# Patient Record
Sex: Female | Born: 1975 | Race: Black or African American | Hispanic: No | Marital: Married | Smoking: Never smoker
Health system: Southern US, Community
[De-identification: ages and names within clinical notes are randomized; demographics above are authoritative.]

## PROBLEM LIST (undated history)

## (undated) ENCOUNTER — Inpatient Hospital Stay (HOSPITAL_COMMUNITY): Payer: Self-pay

## (undated) DIAGNOSIS — Z8619 Personal history of other infectious and parasitic diseases: Secondary | ICD-10-CM

## (undated) DIAGNOSIS — D219 Benign neoplasm of connective and other soft tissue, unspecified: Secondary | ICD-10-CM

## (undated) DIAGNOSIS — J45909 Unspecified asthma, uncomplicated: Secondary | ICD-10-CM

## (undated) DIAGNOSIS — O09529 Supervision of elderly multigravida, unspecified trimester: Secondary | ICD-10-CM

## (undated) DIAGNOSIS — B009 Herpesviral infection, unspecified: Secondary | ICD-10-CM

## (undated) HISTORY — DX: Supervision of elderly multigravida, unspecified trimester: O09.529

## (undated) HISTORY — DX: Personal history of other infectious and parasitic diseases: Z86.19

## (undated) HISTORY — PX: NO PAST SURGERIES: SHX2092

---

## 2004-09-16 ENCOUNTER — Ambulatory Visit: Payer: Self-pay | Admitting: Internal Medicine

## 2006-11-16 ENCOUNTER — Ambulatory Visit: Payer: Self-pay | Admitting: Internal Medicine

## 2008-03-10 ENCOUNTER — Ambulatory Visit: Payer: Self-pay | Admitting: Internal Medicine

## 2008-03-10 DIAGNOSIS — J309 Allergic rhinitis, unspecified: Secondary | ICD-10-CM | POA: Insufficient documentation

## 2008-03-10 DIAGNOSIS — J45909 Unspecified asthma, uncomplicated: Secondary | ICD-10-CM | POA: Insufficient documentation

## 2011-01-07 NOTE — Assessment & Plan Note (Signed)
[facility]                             PULMONARY OFFICE NOTE   NAME:Teresa Barber, Teresa Barber                       MRN:          [id number]  DATE:11/16/2006                            DOB:          04-28-1976    PROBLEM:  1. Asthma.  2. Allergic rhinitis.   HISTORY:  She is out of medications now, but says as long as she has her  routine meds, her asthma and rhinitis are well controlled.  She was  pregnant when last here and stopped her allergy vaccine while pregnant  in 2006.  No seasonal flare yet.  She has her hands full with 2 young  sons today.   MEDICATION:  1. Advair 250/50.  2. Allegra 60 mg.  3. Birth control pills.  4. Rescue albuterol inhaler.   No medication allergy.   OBJECTIVE:  Weight 155 pounds, BP 108/72, pulse 90, room air saturation  97%.  There is white mucous in her nose which is not obstructed.  No evidence  of postnasal drainage or pharyngeal irritation.  No adenopathy.  Lung fields are clear with good airflow bilaterally.  Heart sounds are regular without murmur.   IMPRESSION:  Allergic rhinitis and asthma, adequately controlled when  medications are available.  We discussed options again.   PLAN:  Refill Advair 250/50, Allegra 60 mg b.i.d. p.r.n., and albuterol  rescue inhaler for 2 puffs q.i.d. p.r.n. use.  We discussed common  triggers to avoid.  Schedule return in 1 year.     Clinton D. Maple Hudson, MD, Tonny Bollman, FACP  Electronically Signed    CDY/MedQ  DD: 11/18/2006  DT: 11/18/2006  Job #: [id number]

## 2013-10-09 ENCOUNTER — Other Ambulatory Visit: Payer: Self-pay | Admitting: Obstetrics and Gynecology

## 2013-10-09 DIAGNOSIS — N6019 Diffuse cystic mastopathy of unspecified breast: Secondary | ICD-10-CM

## 2013-10-22 ENCOUNTER — Ambulatory Visit
Admission: RE | Admit: 2013-10-22 | Discharge: 2013-10-22 | Disposition: A | Discharge status: Home / Self Care | Payer: 59 | Source: Ambulatory Visit | Attending: Obstetrics and Gynecology | Admitting: Obstetrics and Gynecology

## 2013-10-22 DIAGNOSIS — N6019 Diffuse cystic mastopathy of unspecified breast: Secondary | ICD-10-CM

## 2015-04-25 ENCOUNTER — Emergency Department (HOSPITAL_COMMUNITY): Payer: Medicaid Other

## 2015-04-25 ENCOUNTER — Encounter (HOSPITAL_COMMUNITY): Payer: Self-pay | Admitting: Emergency Medicine

## 2015-04-25 ENCOUNTER — Emergency Department (HOSPITAL_COMMUNITY)
Admission: EM | Admit: 2015-04-25 | Discharge: 2015-04-25 | Disposition: A | Discharge status: Home / Self Care | Payer: Medicaid Other | Attending: Emergency Medicine | Admitting: Emergency Medicine

## 2015-04-25 DIAGNOSIS — Z3A01 Less than 8 weeks gestation of pregnancy: Secondary | ICD-10-CM | POA: Diagnosis not present

## 2015-04-25 DIAGNOSIS — J45909 Unspecified asthma, uncomplicated: Secondary | ICD-10-CM | POA: Diagnosis not present

## 2015-04-25 DIAGNOSIS — N939 Abnormal uterine and vaginal bleeding, unspecified: Secondary | ICD-10-CM

## 2015-04-25 DIAGNOSIS — O99511 Diseases of the respiratory system complicating pregnancy, first trimester: Secondary | ICD-10-CM | POA: Diagnosis not present

## 2015-04-25 DIAGNOSIS — O209 Hemorrhage in early pregnancy, unspecified: Secondary | ICD-10-CM | POA: Insufficient documentation

## 2015-04-25 HISTORY — DX: Unspecified asthma, uncomplicated: J45.909

## 2015-04-25 LAB — WET PREP, GENITAL
CLUE CELLS WET PREP: NONE SEEN
Trich, Wet Prep: NONE SEEN
Yeast Wet Prep HPF POC: NONE SEEN

## 2015-04-25 LAB — CBC WITH DIFFERENTIAL/PLATELET
Basophils Absolute: 0 10*3/uL (ref 0.0–0.1)
Basophils Relative: 0 % (ref 0–1)
EOS PCT: 8 % — AB (ref 0–5)
Eosinophils Absolute: 0.5 10*3/uL (ref 0.0–0.7)
HEMATOCRIT: 38.3 % (ref 36.0–46.0)
Hemoglobin: 12.5 g/dL (ref 12.0–15.0)
LYMPHS ABS: 2.2 10*3/uL (ref 0.7–4.0)
LYMPHS PCT: 39 % (ref 12–46)
MCH: 26.9 pg (ref 26.0–34.0)
MCHC: 32.6 g/dL (ref 30.0–36.0)
MCV: 82.5 fL (ref 78.0–100.0)
MONO ABS: 0.2 10*3/uL (ref 0.1–1.0)
MONOS PCT: 4 % (ref 3–12)
NEUTROS ABS: 2.7 10*3/uL (ref 1.7–7.7)
Neutrophils Relative %: 49 % (ref 43–77)
PLATELETS: 215 10*3/uL (ref 150–400)
RBC: 4.64 MIL/uL (ref 3.87–5.11)
RDW: 12.6 % (ref 11.5–15.5)
WBC: 5.6 10*3/uL (ref 4.0–10.5)

## 2015-04-25 LAB — I-STAT BETA HCG BLOOD, ED (MC, WL, AP ONLY): I-stat hCG, quantitative: 467.1 m[IU]/mL — ABNORMAL HIGH (ref <5)

## 2015-04-25 LAB — TYPE AND SCREEN
ABO/RH(D): O POS
Antibody Screen: NEGATIVE

## 2015-04-25 MED ORDER — HYDROMORPHONE HCL 1 MG/ML IJ SOLN: 1.0000 mg | Freq: Once | INTRAMUSCULAR | Status: DC

## 2015-04-25 NOTE — ED Notes (Signed)
Pt c/o vaginal bleeding x 2 days.  States last menstrual cycle was normal and ended 3 days ago, but the next day, started having some more spotting and lower back pain/cramps.  Yesterday, pt noticed moderate vaginal bleeding.  Pt denies birth control and took 2 home pregnancy tests 1 week ago and 3 days ago, both came back questionable/"faint."

## 2015-04-25 NOTE — Discharge Instructions (Signed)
Please follow up at the [facility] to have a beta hcg drawn in 48-72 hours and an OB Ultrasound in 10 days.  Please return to the Emergency Department if symptoms worsen or new onset of abdominal pain, nausea, vomiting, fever, lightheadedness, weakness.

## 2015-04-25 NOTE — ED Notes (Signed)
PA at bedside.

## 2015-04-25 NOTE — ED Notes (Signed)
Patient transported to Ultrasound 

## 2015-04-25 NOTE — ED Provider Notes (Signed)
CSN: [id number]     Arrival date & time 04/25/15  1111 History   First MD Initiated Contact with Patient 04/25/15 1114     Chief Complaint  Patient presents with  . Vaginal Bleeding     (Consider location/radiation/quality/duration/timing/severity/associated sxs/prior Treatment) HPI Comments: Pt is a 39 yo female who presents to the ED with complaint of vaginal bleeding, onset 2 days. Pt reports her LMP was 04/17/15 and was longer (7days vs. normally 5 days), heavier and she has more cramping than usual. She notes her period ended 3 days ago and then the following day she started to have some spotting and lower back pain/cramping that lasted for appx. 5 min. Pt denies using any form of birth control and states she took two home pregnancy tests that both came back "questionable". Pt is G3P2A1 (planned abortion). Endorses lightheadedness, nausea. Denies headache, SOB, CP, diarrhea, constipation, blood in stool or urine, urinary sxs. Denies any  abdominal pain or nausea while in ED.   Patient is a 39 y.o. female presenting with vaginal bleeding.  Vaginal Bleeding Associated symptoms: abdominal pain and nausea     Past Medical History  Diagnosis Date  . Asthma    History reviewed. No pertinent past surgical history. History reviewed. No pertinent family history. Social History  Substance Use Topics  . Smoking status: Never Smoker   . Smokeless tobacco: None  . Alcohol Use: No   OB History    No data available     Review of Systems  Gastrointestinal: Positive for nausea and abdominal pain.  Genitourinary: Positive for vaginal bleeding.  Neurological: Positive for light-headedness.      Allergies  Review of patient's allergies indicates not on file.  Home Medications   Prior to Admission medications   Not on File   BP 113/85 mmHg  Pulse 68  Temp(Src) 98.6 F (37 C) (Oral)  Resp 16  Ht 5\' 7"  (1.702 m)  Wt 148 lb (67.132 kg)  BMI 23.17 kg/m2  SpO2 99%  LMP 04/22/2015  (Exact Date) Physical Exam  Constitutional: She is oriented to person, place, and time. She appears well-developed and well-nourished.  HENT:  Head: Normocephalic and atraumatic.  Mouth/Throat: Oropharynx is clear and moist.  Eyes: Conjunctivae and EOM are normal. Pupils are equal, round, and reactive to light. Right eye exhibits no discharge. Left eye exhibits no discharge. No scleral icterus.  Neck: Normal range of motion. Neck supple.  Cardiovascular: Normal rate, regular rhythm, normal heart sounds and intact distal pulses.   Pulmonary/Chest: Effort normal and breath sounds normal. She has no wheezes. She has no rales. She exhibits no tenderness.  Abdominal: Soft. Bowel sounds are normal. She exhibits no distension and no mass. There is no tenderness. There is no rebound and no guarding.  Genitourinary: Uterus normal. Uterus is not tender. Cervix exhibits no motion tenderness and no friability. Right adnexum displays fullness. Right adnexum displays no tenderness. Left adnexum displays no tenderness. There is bleeding in the vagina. No tenderness in the vagina. No vaginal discharge found.  Blood oozing from cervical os.   Musculoskeletal: Normal range of motion. She exhibits no edema.  Lymphadenopathy:    She has no cervical adenopathy.  Neurological: She is alert and oriented to person, place, and time.  Skin: Skin is warm and dry.  Nursing note and vitals reviewed.   ED Course  Procedures (including critical care time) Labs Review Labs Reviewed  I-STAT BETA HCG BLOOD, ED ([facility], [facility], [facility] ONLY)  Imaging Review No results found. I have personally reviewed and evaluated these images and lab results as part of my medical decision-making.  Filed Vitals:   04/25/15 1513  BP:   Pulse:   Temp: 98.6 F (37 C)  Resp:     MDM   Final diagnoses:  Vaginal bleeding in pregnancy, first trimester    Pt presents with vaginal bleeding and abdominal cramping and nausea that have since  resolved. G3P2A1. VSS. Pelvic exam revealed blood in vaginal vault and oozing from cervical os, fullness noted to right adnexa without tenderness.   CBC unremarkable. Beta hcg 467.1, ordered OB US. Wet prep unremarkable. Type and screen ordered. Pt is hemodynamically stable at this time.   OB US revealed no intrauterine gestational sac, yolk sac, fetal pole, or cardiac activity visualized.  Discussed results with pt. Pt agrees with plan for d/c and to follow up with [facility] in 48-72 hrs to have a quant beta hcg drawn along with having another US done in 10 days. I feel that the pt is reliable with follow up. Evaluation does not show pathology requring ongoing emergent intervention or admission. Pt is hemodynamically stable and mentating appropriately. All questions answered. Return precautions discussed and outpatient follow up given.      Chesley Noon [place], [place] 04/25/15 1859  Tanna Furry, MD 04/26/15 [phone]

## 2015-04-27 LAB — ABO/RH: ABO/RH(D): O POS

## 2015-04-28 LAB — GC/CHLAMYDIA PROBE AMP (~~LOC~~) NOT AT ARMC
Chlamydia: NEGATIVE
Neisseria Gonorrhea: NEGATIVE

## 2015-09-17 ENCOUNTER — Telehealth (HOSPITAL_BASED_OUTPATIENT_CLINIC_OR_DEPARTMENT_OTHER): Payer: Self-pay | Admitting: Emergency Medicine

## 2016-03-18 ENCOUNTER — Inpatient Hospital Stay (HOSPITAL_COMMUNITY): Payer: BLUE CROSS/BLUE SHIELD

## 2016-03-18 ENCOUNTER — Inpatient Hospital Stay (HOSPITAL_COMMUNITY)
Admission: AD | Admit: 2016-03-18 | Discharge: 2016-03-18 | Disposition: A | Discharge status: Home / Self Care | Payer: BLUE CROSS/BLUE SHIELD | Source: Ambulatory Visit | Attending: Obstetrics and Gynecology | Admitting: Obstetrics and Gynecology

## 2016-03-18 ENCOUNTER — Encounter (HOSPITAL_COMMUNITY): Payer: Self-pay | Admitting: *Deleted

## 2016-03-18 DIAGNOSIS — O26892 Other specified pregnancy related conditions, second trimester: Secondary | ICD-10-CM | POA: Diagnosis present

## 2016-03-18 DIAGNOSIS — O3412 Maternal care for benign tumor of corpus uteri, second trimester: Secondary | ICD-10-CM | POA: Diagnosis not present

## 2016-03-18 DIAGNOSIS — O208 Other hemorrhage in early pregnancy: Secondary | ICD-10-CM | POA: Insufficient documentation

## 2016-03-18 DIAGNOSIS — J45909 Unspecified asthma, uncomplicated: Secondary | ICD-10-CM | POA: Insufficient documentation

## 2016-03-18 DIAGNOSIS — O468X1 Other antepartum hemorrhage, first trimester: Secondary | ICD-10-CM

## 2016-03-18 DIAGNOSIS — O99512 Diseases of the respiratory system complicating pregnancy, second trimester: Secondary | ICD-10-CM | POA: Diagnosis not present

## 2016-03-18 DIAGNOSIS — O09521 Supervision of elderly multigravida, first trimester: Secondary | ICD-10-CM | POA: Diagnosis not present

## 2016-03-18 DIAGNOSIS — O209 Hemorrhage in early pregnancy, unspecified: Secondary | ICD-10-CM

## 2016-03-18 DIAGNOSIS — R109 Unspecified abdominal pain: Secondary | ICD-10-CM | POA: Diagnosis present

## 2016-03-18 DIAGNOSIS — D259 Leiomyoma of uterus, unspecified: Secondary | ICD-10-CM | POA: Diagnosis not present

## 2016-03-18 DIAGNOSIS — O418X10 Other specified disorders of amniotic fluid and membranes, first trimester, not applicable or unspecified: Secondary | ICD-10-CM

## 2016-03-18 LAB — HCG, QUANTITATIVE, PREGNANCY: HCG, BETA CHAIN, QUANT, S: 73606 m[IU]/mL — AB (ref <5)

## 2016-03-18 LAB — URINALYSIS, ROUTINE W REFLEX MICROSCOPIC
Bilirubin Urine: NEGATIVE
Glucose, UA: NEGATIVE mg/dL
KETONES UR: 15 mg/dL — AB
LEUKOCYTES UA: NEGATIVE
NITRITE: NEGATIVE
PH: 6 (ref 5.0–8.0)
Protein, ur: NEGATIVE mg/dL
Specific Gravity, Urine: 1.025 (ref 1.005–1.030)

## 2016-03-18 LAB — POCT PREGNANCY, URINE: Preg Test, Ur: POSITIVE — AB

## 2016-03-18 LAB — URINE MICROSCOPIC-ADD ON

## 2016-03-18 LAB — CBC WITH DIFFERENTIAL/PLATELET
BASOS PCT: 0 %
Basophils Absolute: 0 10*3/uL (ref 0.0–0.1)
EOS ABS: 0.4 10*3/uL (ref 0.0–0.7)
EOS PCT: 3 %
HCT: 36.1 % (ref 36.0–46.0)
Hemoglobin: 12.5 g/dL (ref 12.0–15.0)
LYMPHS ABS: 3.6 10*3/uL (ref 0.7–4.0)
Lymphocytes Relative: 30 %
MCH: 28 pg (ref 26.0–34.0)
MCHC: 34.6 g/dL (ref 30.0–36.0)
MCV: 80.8 fL (ref 78.0–100.0)
MONOS PCT: 3 %
Monocytes Absolute: 0.4 10*3/uL (ref 0.1–1.0)
Neutro Abs: 7.7 10*3/uL (ref 1.7–7.7)
Neutrophils Relative %: 64 %
PLATELETS: 208 10*3/uL (ref 150–400)
RBC: 4.47 MIL/uL (ref 3.87–5.11)
RDW: 13.4 % (ref 11.5–15.5)
WBC: 12.1 10*3/uL — AB (ref 4.0–10.5)

## 2016-03-18 NOTE — Discharge Instructions (Signed)
Subchorionic Hematoma A subchorionic hematoma is a gathering of blood between the outer wall of the placenta and the inner wall of the womb (uterus). The placenta is the organ that connects the fetus to the wall of the uterus. The placenta performs the feeding, breathing (oxygen to the fetus), and waste removal (excretory work) of the fetus.  Subchorionic hematoma is the most common abnormality found on a result from ultrasonography done during the first trimester or early second trimester of pregnancy. If there has been little or no vaginal bleeding, early small hematomas usually shrink on their own and do not affect your baby or pregnancy. The blood is gradually absorbed over 1-2 weeks. When bleeding starts later in pregnancy or the hematoma is larger or occurs in an older pregnant woman, the outcome may not be as good. Larger hematomas may get bigger, which increases the chances for miscarriage. Subchorionic hematoma also increases the risk of premature detachment of the placenta from the uterus, preterm (premature) labor, and stillbirth. HOME CARE INSTRUCTIONS  Stay on bed rest if your health care provider recommends this. Although bed rest will not prevent more bleeding or prevent a miscarriage, your health care provider may recommend bed rest until you are advised otherwise.  Avoid heavy lifting (more than 10 lb [4.5 kg]), exercise, sexual intercourse, or douching as directed by your health care provider.  Keep track of the number of pads you use each day and how soaked (saturated) they are. Write down this information.  Do not use tampons.  Keep all follow-up appointments as directed by your health care provider. Your health care provider may ask you to have follow-up blood tests or ultrasound tests or both. SEEK IMMEDIATE MEDICAL CARE IF:  You have severe cramps in your stomach, back, abdomen, or pelvis.  You have a fever.  You pass large clots or tissue. Save any tissue for your health  care provider to look at.  Your bleeding increases or you become lightheaded, feel weak, or have fainting episodes.   This information is not intended to replace advice given to you by your health care provider. Make sure you discuss any questions you have with your health care provider.   Document Released: 11/23/2006 Document Revised: 08/29/2014 Document Reviewed: 03/07/2013 Elsevier Interactive Patient Education 2016 Elsevier Inc.   Vaginal Bleeding During Pregnancy, First Trimester A small amount of bleeding (spotting) from the vagina is common in early pregnancy. Sometimes the bleeding is normal and is not a problem, and sometimes it is a sign of something serious. Be sure to tell your doctor about any bleeding from your vagina right away. HOME CARE  Watch your condition for any changes.  Follow your doctor's instructions about how active you can be.  If you are on bed rest:  You may need to stay in bed and only get up to use the bathroom.  You may be allowed to do some activities.  If you need help, make plans for someone to help you.  Write down:  The number of pads you use each day.  How often you change pads.  How soaked (saturated) your pads are.  Do not use tampons.  Do not douche.  Do not have sex or orgasms until your doctor says it is okay.  If you pass any tissue from your vagina, save the tissue so you can show it to your doctor.  Only take medicines as told by your doctor.  Do not take aspirin because it can make you  bleed.  Keep all follow-up visits as told by your doctor. GET HELP IF:   You bleed from your vagina.  You have cramps.  You have labor pains.  You have a fever that does not go away after you take medicine. GET HELP RIGHT AWAY IF:   You have very bad cramps in your back or belly (abdomen).  You pass large clots or tissue from your vagina.  You bleed more.  You feel light-headed or weak.  You pass out (faint).  You have  chills.  You are leaking fluid or have a gush of fluid from your vagina.  You pass out while pooping (having a bowel movement). MAKE SURE YOU:  Understand these instructions.  Will watch your condition.  Will get help right away if you are not doing well or get worse.   This information is not intended to replace advice given to you by your health care provider. Make sure you discuss any questions you have with your health care provider.   Document Released: 12/23/2013 Document Reviewed: 12/23/2013 Elsevier Interactive Patient Education [employer].

## 2016-03-18 NOTE — MAU Provider Note (Signed)
History     CSN: [id number]  Arrival date and time: 03/18/16 1900   First Provider Initiated Contact with Patient 03/18/16 2150      Chief Complaint  Patient presents with  . Vaginal Bleeding  . Abdominal Cramping   HPI Ms. Teresa Barber is a 40 y.o. [id number] at [redacted]w[redacted]d who presents to MAU today with complaint of vaginal bleeding. The patient has a known subchorionic hemorrhage and has had light bleeding off and on prior to today. Today she states sudden onset of heavier bleeding and passing either a large clot of a gestation sac. She has had mild tenderness touch in the lower abdomen, but denies cramping or pain currently. She also denies fever or recent intercourse.   OB History    Gravida Para Term Preterm AB Living   6 2 2   3 2    SAB TAB Ectopic Multiple Live Births   2       2      Past Medical History:  Diagnosis Date  . Asthma     Past Surgical History:  Procedure Laterality Date  . NO PAST SURGERIES      Family History  Problem Relation Age of Onset  . Asthma Mother     Social History  Substance Use Topics  . Smoking status: Never Smoker  . Smokeless tobacco: Never Used  . Alcohol use No    Allergies: No Known Allergies  Prescriptions Prior to Admission  Medication Sig Dispense Refill Last Dose  . Ascorbic Acid (VITAMIN C PO) Take 1 tablet by mouth daily.   03/17/2016 at Unknown time  . DICLEGIS 10-10 MG TBEC Take 1-2 tablets by mouth 3 (three) times daily as needed (nausea).   1 03/18/2016 at Unknown time  . IRON PO Take 1 tablet by mouth at bedtime.   03/17/2016 at Unknown time  . Prenatal Vit-Fe Fumarate-FA (PRENATAL MULTIVITAMIN) TABS tablet Take 1 tablet by mouth daily at 12 noon.   03/17/2016 at Unknown time    Review of Systems  Constitutional: Negative for fever and malaise/fatigue.  Gastrointestinal: Negative for abdominal pain, constipation, diarrhea, nausea and vomiting.  Genitourinary: Negative for dysuria, frequency and urgency.       +  vaginal bleeding   Physical Exam   Blood pressure 107/62, pulse 84, temperature 98.9 F (37.2 C), resp. rate 18, height 5' 7.5" (1.715 m), weight 150 lb 9.6 oz (68.3 kg).  Physical Exam  Nursing note and vitals reviewed. Constitutional: She is oriented to person, place, and time. She appears well-developed and well-nourished. No distress.  HENT:  Head: Normocephalic and atraumatic.  Cardiovascular: Normal rate.   Respiratory: Effort normal.  GI: Soft. She exhibits no distension and no mass. There is tenderness (mild tenderness to palpation of the lower abdomen). There is no rebound and no guarding.  Genitourinary: Uterus is enlarged and tender. Cervix exhibits no motion tenderness, no discharge and no friability. There is bleeding (small blood pooling in vagina) in the vagina. No vaginal discharge found.  Genitourinary Comments: Cervix: visually closed  Neurological: She is alert and oriented to person, place, and time.  Skin: Skin is warm and dry. No erythema.  Psychiatric: She has a normal mood and affect.    Results for orders placed or performed during the hospital encounter of 03/18/16 (from the past 24 hour(s))  Urinalysis, Routine w reflex microscopic (not at [facility])     Status: Abnormal   Collection Time: 03/18/16  7:30 PM  Result Value Ref  Range   Color, Urine YELLOW YELLOW   APPearance CLEAR CLEAR   Specific Gravity, Urine 1.025 1.005 - 1.030   pH 6.0 5.0 - 8.0   Glucose, UA NEGATIVE NEGATIVE mg/dL   Hgb urine dipstick MODERATE (A) NEGATIVE   Bilirubin Urine NEGATIVE NEGATIVE   Ketones, ur 15 (A) NEGATIVE mg/dL   Protein, ur NEGATIVE NEGATIVE mg/dL   Nitrite NEGATIVE NEGATIVE   Leukocytes, UA NEGATIVE NEGATIVE  Urine microscopic-add on     Status: Abnormal   Collection Time: 03/18/16  7:30 PM  Result Value Ref Range   Squamous Epithelial / LPF 0-5 (A) NONE SEEN   WBC, UA 0-5 0 - 5 WBC/hpf   RBC / HPF 0-5 0 - 5 RBC/hpf   Bacteria, UA FEW (A) NONE SEEN  Pregnancy,  urine POC     Status: Abnormal   Collection Time: 03/18/16  7:40 PM  Result Value Ref Range   Preg Test, Ur POSITIVE (A) NEGATIVE  CBC with Differential/Platelet     Status: Abnormal   Collection Time: 03/18/16  8:30 PM  Result Value Ref Range   WBC 12.1 (H) 4.0 - 10.5 K/uL   RBC 4.47 3.87 - 5.11 MIL/uL   Hemoglobin 12.5 12.0 - 15.0 g/dL   HCT 36.1 36.0 - 46.0 %   MCV 80.8 78.0 - 100.0 fL   MCH 28.0 26.0 - 34.0 pg   MCHC 34.6 30.0 - 36.0 g/dL   RDW 13.4 11.5 - 15.5 %   Platelets 208 150 - 400 K/uL   Neutrophils Relative % 64 %   Neutro Abs 7.7 1.7 - 7.7 K/uL   Lymphocytes Relative 30 %   Lymphs Abs 3.6 0.7 - 4.0 K/uL   Monocytes Relative 3 %   Monocytes Absolute 0.4 0.1 - 1.0 K/uL   Eosinophils Relative 3 %   Eosinophils Absolute 0.4 0.0 - 0.7 K/uL   Basophils Relative 0 %   Basophils Absolute 0.0 0.0 - 0.1 K/uL  hCG, quantitative, pregnancy     Status: Abnormal   Collection Time: 03/18/16  8:30 PM  Result Value Ref Range   hCG, Beta Chain, Quant, S 73,606 (H) <5 mIU/mL   US Ob Comp Less 14 Wks  Result Date: 03/18/2016 CLINICAL DATA:  Heavy vaginal bleeding EXAM: OBSTETRIC <14 WK ULTRASOUND TECHNIQUE: Transabdominal ultrasound was performed for evaluation of the gestation as well as the maternal uterus and adnexal regions. COMPARISON:  None. FINDINGS: Intrauterine gestational sac: Single Yolk sac:  Present Embryo:  Present Cardiac Activity: Present Heart Rate: 162 bpm CRL:   45.5  mm   11 w 2 d                  [place] EDC: 10/05/2016 Subchorionic hemorrhage: Moderate to large complex appearing subchorionic hemorrhage is noted. It measures approximately 5.1 x 4.4 x 1.9 cm in greatest dimension Maternal uterus/adnexae: Multiple uterine fibroids are noted. Largest of these measures 5.6 cm in the posterior myometrium. Right fundal and midbody fibroids are also noted. IMPRESSION: Single live intrauterine gestation at 11 weeks 2 days. Multiple uterine fibroids are noted. Moderate large complex  appearing subchorionic hemorrhage. Electronically Signed   By: Inez Catalina M.D.   On: 03/18/2016 21:18   MAU Course  Procedures None  MDM +UPT CBC, Quant hCG and [place] today  Discussed patient with Dr. Helane Rima, ok for discharge at this time with bleeding precautions and pelvic rest instructions Assessment and Plan  A: SIUP at [redacted]w[redacted]d Vaginal bleeding in pregnancy,  first trimester Moderate to large subchorionic hemorrhage  P: Discharge home Bleeding precautions and pelvic rest discussed Patient advised to follow-up with [facility] as scheduled or sooner PRN Patient may return to MAU as needed or if her condition were to change or worsen   Luvenia Redden, PA-C  03/18/2016, 9:51 PM

## 2016-03-18 NOTE — MAU Note (Signed)
I am [redacted] wks along and think I passed the gestational sac today but unsure if passed it all. Hx subchorionic hemorrhage and had gush fld about 1300 with a lot of bleeding and think I saw the sac. When I get up and move cont to have heavy bleeding

## 2016-06-16 ENCOUNTER — Encounter (HOSPITAL_COMMUNITY): Payer: Self-pay | Admitting: Obstetrics and Gynecology

## 2016-06-16 ENCOUNTER — Other Ambulatory Visit (HOSPITAL_COMMUNITY): Payer: Self-pay | Admitting: Obstetrics and Gynecology

## 2016-06-16 DIAGNOSIS — Z3689 Encounter for other specified antenatal screening: Secondary | ICD-10-CM

## 2016-06-16 DIAGNOSIS — Z3A24 24 weeks gestation of pregnancy: Secondary | ICD-10-CM

## 2016-06-16 DIAGNOSIS — O283 Abnormal ultrasonic finding on antenatal screening of mother: Secondary | ICD-10-CM

## 2016-06-24 ENCOUNTER — Ambulatory Visit (HOSPITAL_COMMUNITY)
Admission: RE | Admit: 2016-06-24 | Discharge: 2016-06-24 | Disposition: A | Discharge status: Home / Self Care | Payer: BLUE CROSS/BLUE SHIELD | Source: Ambulatory Visit | Attending: Obstetrics and Gynecology | Admitting: Obstetrics and Gynecology

## 2016-06-24 ENCOUNTER — Encounter (HOSPITAL_COMMUNITY): Payer: Self-pay

## 2016-06-24 DIAGNOSIS — Z363 Encounter for antenatal screening for malformations: Secondary | ICD-10-CM | POA: Diagnosis not present

## 2016-06-24 DIAGNOSIS — O09522 Supervision of elderly multigravida, second trimester: Secondary | ICD-10-CM | POA: Insufficient documentation

## 2016-06-24 DIAGNOSIS — Z3A24 24 weeks gestation of pregnancy: Secondary | ICD-10-CM | POA: Diagnosis not present

## 2016-06-24 DIAGNOSIS — Z3689 Encounter for other specified antenatal screening: Secondary | ICD-10-CM

## 2016-06-24 DIAGNOSIS — O283 Abnormal ultrasonic finding on antenatal screening of mother: Secondary | ICD-10-CM | POA: Insufficient documentation

## 2016-06-27 ENCOUNTER — Encounter (HOSPITAL_COMMUNITY): Payer: Self-pay

## 2016-06-30 ENCOUNTER — Other Ambulatory Visit (HOSPITAL_COMMUNITY): Payer: Self-pay

## 2016-07-13 ENCOUNTER — Encounter (HOSPITAL_COMMUNITY): Payer: Self-pay | Admitting: *Deleted

## 2016-07-13 ENCOUNTER — Inpatient Hospital Stay (HOSPITAL_COMMUNITY)
Admission: AD | Admit: 2016-07-13 | Discharge: 2016-07-19 | DRG: 782 | Disposition: A | Discharge status: Home / Self Care | Payer: BLUE CROSS/BLUE SHIELD | Source: Ambulatory Visit | Attending: Obstetrics and Gynecology | Admitting: Obstetrics and Gynecology

## 2016-07-13 ENCOUNTER — Inpatient Hospital Stay (HOSPITAL_COMMUNITY): Payer: BLUE CROSS/BLUE SHIELD

## 2016-07-13 DIAGNOSIS — Z3A27 27 weeks gestation of pregnancy: Secondary | ICD-10-CM | POA: Diagnosis not present

## 2016-07-13 DIAGNOSIS — O3442 Maternal care for other abnormalities of cervix, second trimester: Secondary | ICD-10-CM | POA: Diagnosis present

## 2016-07-13 DIAGNOSIS — O4592 Premature separation of placenta, unspecified, second trimester: Secondary | ICD-10-CM | POA: Diagnosis present

## 2016-07-13 DIAGNOSIS — O09522 Supervision of elderly multigravida, second trimester: Secondary | ICD-10-CM | POA: Diagnosis not present

## 2016-07-13 DIAGNOSIS — O4593 Premature separation of placenta, unspecified, third trimester: Secondary | ICD-10-CM

## 2016-07-13 DIAGNOSIS — O3412 Maternal care for benign tumor of corpus uteri, second trimester: Secondary | ICD-10-CM | POA: Diagnosis present

## 2016-07-13 DIAGNOSIS — N841 Polyp of cervix uteri: Secondary | ICD-10-CM | POA: Diagnosis present

## 2016-07-13 HISTORY — DX: Benign neoplasm of connective and other soft tissue, unspecified: D21.9

## 2016-07-13 LAB — CBC
HEMATOCRIT: 34.8 % — AB (ref 36.0–46.0)
Hemoglobin: 11.9 g/dL — ABNORMAL LOW (ref 12.0–15.0)
MCH: 28.7 pg (ref 26.0–34.0)
MCHC: 34.2 g/dL (ref 30.0–36.0)
MCV: 84.1 fL (ref 78.0–100.0)
PLATELETS: 189 10*3/uL (ref 150–400)
RBC: 4.14 MIL/uL (ref 3.87–5.11)
RDW: 13.8 % (ref 11.5–15.5)
WBC: 12.6 10*3/uL — AB (ref 4.0–10.5)

## 2016-07-13 LAB — DIC (DISSEMINATED INTRAVASCULAR COAGULATION) PANEL
APTT: 28 s (ref 24–36)
PLATELETS: 189 10*3/uL (ref 150–400)
SMEAR REVIEW: NONE SEEN

## 2016-07-13 LAB — DIC (DISSEMINATED INTRAVASCULAR COAGULATION)PANEL
D-Dimer, Quant: 2.07 ug/mL-FEU — ABNORMAL HIGH (ref 0.00–0.50)
Fibrinogen: 499 mg/dL — ABNORMAL HIGH (ref 210–475)
INR: 0.98
Prothrombin Time: 13 seconds (ref 11.4–15.2)

## 2016-07-13 LAB — COMPREHENSIVE METABOLIC PANEL
ALBUMIN: 3.2 g/dL — AB (ref 3.5–5.0)
ALT: 16 U/L (ref 14–54)
AST: 25 U/L (ref 15–41)
Alkaline Phosphatase: 55 U/L (ref 38–126)
Anion gap: 6 (ref 5–15)
BUN: 8 mg/dL (ref 6–20)
CHLORIDE: 106 mmol/L (ref 101–111)
CO2: 24 mmol/L (ref 22–32)
CREATININE: 0.56 mg/dL (ref 0.44–1.00)
Calcium: 9.5 mg/dL (ref 8.9–10.3)
GFR calc Af Amer: >60 mL/min (ref >60)
GLUCOSE: 75 mg/dL (ref 65–99)
POTASSIUM: 3.8 mmol/L (ref 3.5–5.1)
SODIUM: 136 mmol/L (ref 135–145)
Total Bilirubin: 0.4 mg/dL (ref 0.3–1.2)
Total Protein: 6.4 g/dL — ABNORMAL LOW (ref 6.5–8.1)

## 2016-07-13 LAB — TYPE AND SCREEN
ABO/RH(D): O POS
Antibody Screen: NEGATIVE

## 2016-07-13 LAB — ABO/RH: ABO/RH(D): O POS

## 2016-07-13 MED ORDER — LACTATED RINGERS IV SOLN
INTRAVENOUS | Status: DC
  Administered 2016-07-13 (×2): via INTRAVENOUS

## 2016-07-13 MED ORDER — LACTATED RINGERS IV SOLN
INTRAVENOUS | Status: DC
  Administered 2016-07-14 – 2016-07-15 (×2): via INTRAVENOUS

## 2016-07-13 MED ORDER — DOCUSATE SODIUM 100 MG PO CAPS
100.0000 mg | ORAL_CAPSULE | Freq: Every day | ORAL | Status: DC
  Administered 2016-07-14 – 2016-07-17 (×4): 100 mg via ORAL
  Filled 2016-07-13 (×8): qty 1

## 2016-07-13 MED ORDER — ZOLPIDEM TARTRATE 5 MG PO TABS: 5.0000 mg | ORAL_TABLET | Freq: Every evening | ORAL | Status: DC | PRN

## 2016-07-13 MED ORDER — CALCIUM CARBONATE ANTACID 500 MG PO CHEW: 2.0000 | CHEWABLE_TABLET | ORAL | Status: DC | PRN

## 2016-07-13 MED ORDER — PRENATAL MULTIVITAMIN CH
1.0000 | ORAL_TABLET | Freq: Every day | ORAL | Status: DC
  Administered 2016-07-14 – 2016-07-17 (×2): 1 via ORAL
  Filled 2016-07-13 (×5): qty 1

## 2016-07-13 MED ORDER — ACETAMINOPHEN 325 MG PO TABS: 650.0000 mg | ORAL_TABLET | ORAL | Status: DC | PRN

## 2016-07-13 MED ORDER — MAGNESIUM SULFATE BOLUS VIA INFUSION
4.0000 g | Freq: Once | INTRAVENOUS | Status: AC
  Administered 2016-07-13: 4 g via INTRAVENOUS
  Filled 2016-07-13: qty 500

## 2016-07-13 MED ORDER — BETAMETHASONE SOD PHOS & ACET 6 (3-3) MG/ML IJ SUSP
12.0000 mg | INTRAMUSCULAR | Status: AC
  Administered 2016-07-13 – 2016-07-14 (×2): 12 mg via INTRAMUSCULAR
  Filled 2016-07-13 (×2): qty 2

## 2016-07-13 MED ORDER — MAGNESIUM SULFATE 50 % IJ SOLN
2.0000 g/h | INTRAVENOUS | Status: DC
  Administered 2016-07-14 – 2016-07-15 (×2): 2 g/h via INTRAVENOUS
  Filled 2016-07-13 (×3): qty 80

## 2016-07-13 NOTE — Progress Notes (Signed)
No feeling contractions  VSS Afeb  Uterus NT  UCs about q4-5 min FHT + accels, 2 decels x 2-3 min each, not repetitive   Results for orders placed or performed during the hospital encounter of 07/13/16 (from the past 24 hour(s))  CBC     Status: Abnormal   Collection Time: 07/13/16 12:30 PM  Result Value Ref Range   WBC 12.6 (H) 4.0 - 10.5 K/uL   RBC 4.14 3.87 - 5.11 MIL/uL   Hemoglobin 11.9 (L) 12.0 - 15.0 g/dL   HCT 34.8 (L) 36.0 - 46.0 %   MCV 84.1 78.0 - 100.0 fL   MCH 28.7 26.0 - 34.0 pg   MCHC 34.2 30.0 - 36.0 g/dL   RDW 13.8 11.5 - 15.5 %   Platelets 189 150 - 400 K/uL  DIC panel     Status: Abnormal   Collection Time: 07/13/16 12:30 PM  Result Value Ref Range   Prothrombin Time 13.0 11.4 - 15.2 seconds   INR 0.98    aPTT 28 24 - 36 seconds   Fibrinogen 499 (H) 210 - 475 mg/dL   D-Dimer, Quant 2.07 (H) 0.00 - 0.50 ug/mL-FEU   Platelets 189 150 - 400 K/uL   Smear Review NO SCHISTOCYTES SEEN   Comprehensive metabolic panel     Status: Abnormal   Collection Time: 07/13/16 12:30 PM  Result Value Ref Range   Sodium 136 135 - 145 mmol/L   Potassium 3.8 3.5 - 5.1 mmol/L   Chloride 106 101 - 111 mmol/L   CO2 24 22 - 32 mmol/L   Glucose, Bld 75 65 - 99 mg/dL   BUN 8 6 - 20 mg/dL   Creatinine, Ser 0.56 0.44 - 1.00 mg/dL   Calcium 9.5 8.9 - 10.3 mg/dL   Total Protein 6.4 (L) 6.5 - 8.1 g/dL   Albumin 3.2 (L) 3.5 - 5.0 g/dL   AST 25 15 - 41 U/L   ALT 16 14 - 54 U/L   Alkaline Phosphatase 55 38 - 126 U/L   Total Bilirubin 0.4 0.3 - 1.2 mg/dL   GFR calc non Af Amer >60 >60 mL/min   GFR calc Af Amer >60 >60 mL/min   Anion gap 6 5 - 15  Type and screen [place]     Status: None   Collection Time: 07/13/16 12:30 PM  Result Value Ref Range   ABO/RH(D) O POS    Antibody Screen NEG    Sample Expiration 07/16/2016   ABO/Rh     Status: None   Collection Time: 07/13/16 12:30 PM  Result Value Ref Range   ABO/RH(D) O POS    A/P: 27 1/7 weeks  Marginal Abruption         PT UCs          Fibroids         Breech          Will start magnesium sulfate tocolysis, BMTZ         MFM and neonatology consult pending

## 2016-07-13 NOTE — MAU Note (Signed)
Patient sent from office for further evaluation of possible abruption. Patient states she wants to wait until after the U/S to decide whether she agrees to take betamethasone. States she does not wish to take it if it turns out that her placenta is fine and there is not a problem.

## 2016-07-13 NOTE — MAU Note (Signed)
Patient passed a "palm size" clot about a week ago and did not inform MD. No further bleeding. MD will discuss U/S and current status with neonatologist and make a plan.

## 2016-07-13 NOTE — Progress Notes (Signed)
MD will review FHR tracing and call RN with further orders.

## 2016-07-13 NOTE — Consult Note (Signed)
Neonatology Consultation: Requested by: Dr.Tomblin  I conferred with Teresa Barber and her family this evening about the likely problems expected at 27.5 weeks pre-term delivery,which included RDS, intestinal immaturity, survival likelihood, IVH probability, and increased risk for infection.  The parents questions were mainly about the need for monitoring of the pregnancy if she were able to be discharged in a couple of days.  I offered to return to answer further questions if parents have any.  Teresa Barber L. Darrelyn Hillock.D.

## 2016-07-13 NOTE — Consult Note (Signed)
MFM Note  Teresa Barber is a 40 year old G74P2A3 AA female at 27+1 weeks who was admitted earlier today from the office. She presented for a routine prenatal visit but reported having bright red bleeding one week ago. No associated pain or cramping. This was followed by 4-5 days of dark brown spotting. Fetal movement has been very good with no significant change.  Her bleeding episodes began in the first trimester and a [facility] was seen at that time. She reports having similar bouts of bleeding every 4-6 weeks - some communicated to her OB and others not.  [place] today in the office revealed a normally growth female fetus with an EFW at the 71st %tile (2+3) and normal AFV. New findings were a moderate [place] as well as an impressive amount of particulate matter in the amniotic fluid. There was also several previously visualized fibroids.   On speculum exam, a cervical polyp was identified originating from the external surface of the cervix - this was not previously noted.  On external monitors: FHTs - one or two possible prolonged decelerations and a few variable decels associated with mild uterine contractions (not felt by pt); otherwise, normal variability for this early gestational age; toco: q 3-4 contractions/irritabillity; no decelerations while I was with pt  Since admission, she has received the first injection of BMZ and was started on magnesium sulfate. 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 Please call with questions or concerns.  (Face-to-face consultation with patient: 41 mins)

## 2016-07-13 NOTE — MAU Note (Signed)
Artifact noted on tracing and cardio readjusted. RN was at bedside.  Did not think it was a deceleration.

## 2016-07-13 NOTE — H&P (Signed)
Teresa Barber is a 40 y.o. female presenting for vaginal bleeding. Pregnancy complicated by uterine fibroids and AMA. Lucina Mellow is normal female. Has had vaginal spotting on/off through much of pregnancy. About 1 week ago passed a palm sized clot followed by bleeding. She got off of her feet and bleeding subsided. Today feels fine with minimal spotting. U/S in office showed breech, EFW 2#2oz (71%), AFI 18.4 and moderate sized [place] probably extending under edge of the posterior placenta. AF is also very echogenic today. These findings were not seen on previous U/S. Cx = 5.0 cm, no funneliing. Multiple fibroids with largest 7.1 cm. OB History    Gravida Para Term Preterm AB Living   6 2 2   3 2    SAB TAB Ectopic Multiple Live Births   2       2     Past Medical History:  Diagnosis Date  . Asthma   . Fibroid    Past Surgical History:  Procedure Laterality Date  . NO PAST SURGERIES     Family History: family history includes Asthma in her mother; Early death in her mother. Social History:  reports that she has never smoked. She has never used smokeless tobacco. She reports that she does not drink alcohol or use drugs.     Maternal Diabetes: No Genetic Screening: Normal Maternal Ultrasounds/Referrals: Abnormal:  Findings:   Other:see above Fetal Ultrasounds or other Referrals:  Referred to Materal Fetal Medicine  Maternal Substance Abuse:  No Significant Maternal Medications:  None Significant Maternal Lab Results:  None Other Comments:  None  ROS Maternal Medical History:  Fetal activity: Perceived fetal activity is normal.        Blood pressure 115/67, pulse 66, temperature 98.8 F (37.1 C), temperature source Oral, resp. rate 20, height 5\' 7"  (1.702 m), weight 173 lb (78.5 kg), last menstrual period 12/30/2015, SpO2 98 %. Maternal Exam:  Abdomen: Fetal presentation: breech     Fetal Exam Fetal State Assessment: Category I - tracings are normal.     Physical Exam   Cardiovascular: Normal rate and regular rhythm.   Respiratory: Effort normal and breath sounds normal.  GI: Soft. There is no tenderness.  Genitourinary:  Genitourinary Comments: Uterus non tender  Neurological: She has normal reflexes.    Cx FT at ext os, thick, scant dark blood and a 3 cm polyp on a stalk arising from external surface of cervix per DR Royston Sinner  Prenatal labs: ABO, Rh: --/--/O POS (11/22 1230) Antibody: NEG (11/22 1230) Rubella:   RPR:    HBsAg:    HIV:    GBS:     Assessment/Plan: 40 yo G6P2 @ 27 1/7 weeks with probable marginal placental abruption Fibroids Breech D/W patient and husband above with risks of extension of abruption with fetal distress D/W Dr Vance Gather requested for patient D/W Dr Smith-neonatology-consult for patient requested IV fluids, monitors Betamethasone series U/S in am   Luciann Gossett II,Siera Beyersdorf E 07/13/2016, 2:31 PM

## 2016-07-13 NOTE — MAU Note (Signed)
Urine in lab 

## 2016-07-14 ENCOUNTER — Inpatient Hospital Stay (HOSPITAL_COMMUNITY): Payer: BLUE CROSS/BLUE SHIELD

## 2016-07-14 NOTE — Progress Notes (Signed)
Patient ID: Teresa Barber, female   DOB: Nov 04, 1975, 40 y.o.   MRN: [id number] Pt without complaints GFM No bleeding today Can't feel ctxs  VSSAF FHR 140s with accels, Occas mild variables with ctxs Ctxs irregular mild  Abd:  Gravid, nt Neg homans bilaterally  Assessment: 1) SIUP at 27+2 weeks 2) Marginal abruption; possible chronic  3) Cervical polyp - possibly contributing to some of the vaginal bleeding/spotting  4) Normally grown, active fetus 5) Uterine irritability - much improved on magnesium sulfate 6) S/P first dose of BMZ 7) AMA - low risk NIPS  Plan: - complete course of BMZ - continue magnesium until AM and reassess need for tocolysis - observe for further bleeding x 24 to 48 hours, then consider outpt management (most recent bleed was on 11/15) - US done and prelim normal

## 2016-07-15 MED ORDER — LACTATED RINGERS IV SOLN
1.0000 g/h | INTRAVENOUS | Status: AC
  Filled 2016-07-15: qty 80

## 2016-07-15 MED ORDER — SODIUM CHLORIDE 0.9% FLUSH
3.0000 mL | Freq: Two times a day (BID) | INTRAVENOUS | Status: DC
  Administered 2016-07-15 – 2016-07-18 (×7): 3 mL via INTRAVENOUS

## 2016-07-15 NOTE — Progress Notes (Signed)
Patient ID: Teresa Barber, female   DOB: [birth year], 40 y.o.   MRN: [id number] Pt without complaints GFM No vag bleeding  VSSAF FHR 140s no decels Ctxs 0-2x and hour, occas UI  Abd Gravid, nt Neg homans  Assessment: 1) SIUP at 27+3 weeks 2) Marginal abruption; possible chronic -Stable 3) Cervical polyp - possibly contributing to some of the vaginal bleeding/spotting  4) Normally grown, active fetus 5) Uterine irritability - much improved on magnesium sulfate.  Will d/c today 6) S/P  BMZ 7) AMA - low risk NIPS [place] from yesterday prelim normal - MFM has not read yet DL

## 2016-07-16 LAB — TYPE AND SCREEN
ABO/RH(D): O POS
Antibody Screen: NEGATIVE

## 2016-07-16 NOTE — Progress Notes (Signed)
Patient ID: Teresa Barber, female   DOB: 10/25/1975, 40 y.o.   MRN: [id number] Pt without complaints GFM No vag bleeding Can feel ctxs when they occur  VSSAF FHR 140s no decels Ctxs 0-2x and hour, occas UI  Abd Gravid, nt Neg homans  Assessment: 1) SIUP at 27+4weeks 2) Marginal abruption; possible chronic -Stable with no bleeding 3) Cervical polyp - possibly contributing to some of the vaginal bleeding/spotting  4) Normally grown, active fetus 5) Uterine irritability - Stable off Magnesium 6) S/P  BMZ 7) AMA - low risk NIPS [place] from yesterday prelim normal - MFM has not read yet.  Still no fu from [place] 2 days ago.  Prelim on chart Will increase activity today, and reassess disposition tomorrow DL

## 2016-07-17 NOTE — Progress Notes (Signed)
Patient ID: Aidaly Escatel, female   DOB: 26-Oct-1975, 40 y.o.   MRN: [id number] Pt without complaints GFM except was decreased this morning prompting Fetal monitoring No vag bleeding Can feel ctxs when they occur  VSSAF FHR 140s with accel Had 2 prolonged decels this am lasting for 1-2 minutes with spontaneous recovery Ctxs 0-4x and hour, occas UI  Abd Gravid, nt Neg homans bilaterally,  No vag bleeding  Assessment: 1) SIUP at 27+5 weeks 2) Marginal abruption; possible chronic -Stable with no bleeding [place] from 3 days ago now final, read as no evidence of [facility] or abruption (different than from office) Now with occas decels in setting of possible abruption.  Would rec we continue EFM, will recheck [place] with BPP in am and MFM consult 3) Cervical polyp - possibly contributing to some of the vaginal bleeding/spotting  4) Normally grown, active fetus 5) Uterine irritability - Stable off Magnesium 6) S/P BMZ 7) AMA - low risk NIPS DL

## 2016-07-18 ENCOUNTER — Inpatient Hospital Stay (HOSPITAL_COMMUNITY): Payer: BLUE CROSS/BLUE SHIELD

## 2016-07-18 MED ORDER — NIFEDIPINE 10 MG PO CAPS
20.0000 mg | ORAL_CAPSULE | Freq: Once | ORAL | Status: AC
  Administered 2016-07-18: 20 mg via ORAL
  Filled 2016-07-18: qty 2

## 2016-07-18 NOTE — Progress Notes (Signed)
Pt denies pain and vb.  Reports good fm.  No lof or ctx.   Pt having ctx on toco but not feeling them.  Was planning for d/c yesterday but 2 prolonged decels noted on fetal monitoring   FHT good variability, no decels Toco q7-10 Abd - gravid, NT Ext - NT, no edema Cvx - deferred  A/P:  27+6 weeks admitted with bleeding and marginal abruption 1. S/p BMZ & Mag sulfate.      Plan for rpt [place] & consult this morning with MFM 2. Vaginal polyp - ? Contributing to some of her vb

## 2016-07-19 NOTE — Progress Notes (Signed)
28 0/7 weeks No bleeding, no pain Good FM  Vitals:   07/19/16 0009 07/19/16 0817  BP: 115/62 108/64  Pulse: 75 72  Resp: 18 20  Temp: 98.4 F (36.9 C) 97.8 F (36.6 C)   Uterus soft, NT FHT cat one, no prolonged decels UCs irregular, mild  U/S yesterday-SCH 7.0x2.6x1/9cm, breech, BPP 8/8  Dr Burnett Harry states can consider outpatient management if tracing OK  A/P: [facility] stable        Breech         Fibroids         S/P BMTZ         D/C home-rest, NST in office 3 days, call for BRB or clots, [facility]

## 2016-07-19 NOTE — Discharge Instructions (Signed)
No vaginal entry No heavy lifting Call for bright red blood or clots per vagina Call for decreased fetal movement

## 2016-07-19 NOTE — Discharge Summary (Signed)
Physician Discharge Summary  Patient ID: Teresa Barber MRN: [id number] DOB/AGE: 1976-04-20 40 y.o.  Admit date: 07/13/2016 Discharge date: 07/19/2016  Admission Diagnoses:27 weeks with [facility], possible placental abruption  Discharge Diagnoses:  Active Problems:   Placental abruption in third trimester   Discharged Condition: good  Hospital Course: admitted for observation. She was noted to be contracting regularly with 2 decelerations noted on fetal monitor. Magnesium sulfate started and betamethasone series done. U/S next day read as normal placenta. Patient observed. 2 days prior to discharge had one more deceleration noted. Repeat U/S yesterday with MFM noted 7/2/2.6 cm [place], BPP 8/8. No further significant decelerations noted.  Consults: MFM  Significant Diagnostic Studies: U/S  Treatments: IV hydration and betamethasone, magnesium sulfate  Discharge Exam: Blood pressure 108/64, pulse 72, temperature 97.8 F (36.6 C), temperature source Oral, resp. rate 20, height 5\' 7"  (1.702 m), weight 173 lb (78.5 kg), last menstrual period 12/30/2015, SpO2 97 %. General appearance: alert, cooperative and no distress GI: uterus NT  Disposition: 01-Home or Self Care     Medication List    TAKE these medications   acidophilus Caps capsule Take 1 capsule by mouth at bedtime.   ferrous sulfate 325 (65 FE) MG tablet Take 325 mg by mouth at bedtime.   prenatal multivitamin Tabs tablet Take 1 tablet by mouth at bedtime.   TURMERIC PO Take 1 capsule by mouth at bedtime.   vitamin C 1000 MG tablet Take 1,000 mg by mouth at bedtime.        Signed: Benjiman Core E 07/19/2016, 8:43 AM

## 2016-07-29 ENCOUNTER — Inpatient Hospital Stay (HOSPITAL_COMMUNITY): Payer: BLUE CROSS/BLUE SHIELD

## 2016-07-29 ENCOUNTER — Inpatient Hospital Stay (HOSPITAL_COMMUNITY)
Admission: AD | Admit: 2016-07-29 | Discharge: 2016-07-29 | Disposition: A | Discharge status: Other Acute Inpatient Hospital | Payer: BLUE CROSS/BLUE SHIELD | Source: Ambulatory Visit | Attending: Obstetrics and Gynecology | Admitting: Obstetrics and Gynecology

## 2016-07-29 ENCOUNTER — Encounter (HOSPITAL_COMMUNITY): Payer: Self-pay | Admitting: Certified Nurse Midwife

## 2016-07-29 DIAGNOSIS — Z3A29 29 weeks gestation of pregnancy: Secondary | ICD-10-CM | POA: Insufficient documentation

## 2016-07-29 DIAGNOSIS — O4693 Antepartum hemorrhage, unspecified, third trimester: Secondary | ICD-10-CM | POA: Diagnosis not present

## 2016-07-29 LAB — URINALYSIS, ROUTINE W REFLEX MICROSCOPIC
BILIRUBIN URINE: NEGATIVE
Glucose, UA: NEGATIVE mg/dL
KETONES UR: NEGATIVE mg/dL
LEUKOCYTES UA: NEGATIVE
NITRITE: NEGATIVE
PROTEIN: NEGATIVE mg/dL
Specific Gravity, Urine: 1.002 — ABNORMAL LOW (ref 1.005–1.030)
pH: 6 (ref 5.0–8.0)

## 2016-07-29 LAB — CBC
HCT: 34.1 % — ABNORMAL LOW (ref 36.0–46.0)
HEMOGLOBIN: 11.8 g/dL — AB (ref 12.0–15.0)
MCH: 29.4 pg (ref 26.0–34.0)
MCHC: 34.6 g/dL (ref 30.0–36.0)
MCV: 85 fL (ref 78.0–100.0)
PLATELETS: 155 10*3/uL (ref 150–400)
RBC: 4.01 MIL/uL (ref 3.87–5.11)
RDW: 14.3 % (ref 11.5–15.5)
WBC: 13 10*3/uL — AB (ref 4.0–10.5)

## 2016-07-29 LAB — ABO/RH: ABO/RH(D): O POS

## 2016-07-29 MED ORDER — MAGNESIUM SULFATE 50 % IJ SOLN: 2.0000 g/h | Freq: Once | INTRAVENOUS | Status: DC

## 2016-07-29 MED ORDER — MAGNESIUM SULFATE 50 % IJ SOLN
2.0000 g/h | INTRAVENOUS | Status: DC
  Administered 2016-07-29: 2 g/h via INTRAVENOUS
  Filled 2016-07-29: qty 80

## 2016-07-29 MED ORDER — LACTATED RINGERS IV BOLUS (SEPSIS)
1000.0000 mL | Freq: Once | INTRAVENOUS | Status: AC
  Administered 2016-07-29: 1000 mL via INTRAVENOUS

## 2016-07-29 MED ORDER — MAGNESIUM SULFATE 4 GM/100ML IV SOLN: 4.0000 g | Freq: Once | INTRAVENOUS | Status: DC

## 2016-07-29 MED ORDER — MAGNESIUM SULFATE BOLUS VIA INFUSION
4.0000 g | Freq: Once | INTRAVENOUS | Status: AC
  Administered 2016-07-29: 4 g via INTRAVENOUS
  Filled 2016-07-29: qty 500

## 2016-07-29 NOTE — MAU Note (Signed)
Pt reports 2 episodes of bleeding in the last 12 hours, now feeling contractions.

## 2016-07-29 NOTE — MAU Provider Note (Signed)
History     CSN: [id number]  Arrival date and time: 07/29/16 [id number]   First Provider Initiated Contact with Patient 07/29/16 [phone]      Chief Complaint  Patient presents with  . Vaginal Bleeding   Teresa Barber is a 40 y.o. [id number] at [redacted]w[redacted]d who presents today with bleeding. She states that she started  Bleeding around 1900 yesterday, and then around 0200 she had another large gush. She also started feeling contractions around that time. She states that the baby has been moving normally.    Vaginal Bleeding  The patient's primary symptoms include pelvic pain and vaginal bleeding. This is a new problem. The current episode started yesterday. The problem occurs constantly. The problem has been gradually worsening. Pain severity now: 5-6/10  The problem affects both sides. She is pregnant. Associated symptoms include abdominal pain and nausea. Pertinent negatives include no chills, constipation, diarrhea, dysuria, fever, frequency, urgency or vomiting. The vaginal bleeding is typical of menses. She has been passing clots. She has not been passing tissue. Nothing aggravates the symptoms. She has tried nothing for the symptoms. Sexual activity: No intercourse in the last 24 hours.    Past Medical History:  Diagnosis Date  . Asthma   . Fibroid     Past Surgical History:  Procedure Laterality Date  . NO PAST SURGERIES      Family History  Problem Relation Age of Onset  . Asthma Mother   . Early death Mother     Social History  Substance Use Topics  . Smoking status: Never Smoker  . Smokeless tobacco: Never Used  . Alcohol use No    Allergies: No Known Allergies  Prescriptions Prior to Admission  Medication Sig Dispense Refill Last Dose  . acidophilus (RISAQUAD) CAPS capsule Take 1 capsule by mouth at bedtime.   07/12/2016 at Unknown time  . Ascorbic Acid (VITAMIN C) 1000 MG tablet Take 1,000 mg by mouth at bedtime.   07/12/2016 at Unknown time  . ferrous sulfate 325 (65 FE) MG  tablet Take 325 mg by mouth at bedtime.   07/12/2016 at Unknown time  . Prenatal Vit-Fe Fumarate-FA (PRENATAL MULTIVITAMIN) TABS tablet Take 1 tablet by mouth at bedtime.    07/12/2016 at Unknown time  . TURMERIC PO Take 1 capsule by mouth at bedtime.    07/12/2016 at Unknown time    Review of Systems  Constitutional: Negative for chills and fever.  Gastrointestinal: Positive for abdominal pain and nausea. Negative for constipation, diarrhea and vomiting.  Genitourinary: Positive for pelvic pain and vaginal bleeding. Negative for dysuria, frequency and urgency.   Physical Exam   Blood pressure 120/70, pulse 87, temperature 97.6 F (36.4 C), temperature source Oral, resp. rate 19, height 5\' 7"  (1.702 m), weight 177 lb (80.3 kg), last menstrual period 12/30/2015, SpO2 100 %.  Physical Exam  Nursing note and vitals reviewed. Constitutional: She is oriented to person, place, and time. She appears well-developed and well-nourished. No distress.  HENT:  Head: Normocephalic.  Cardiovascular: Normal rate.   Respiratory: Effort normal.  GI: Soft. There is no tenderness. There is no rebound.  Neurological: She is alert and oriented to person, place, and time.  Skin: Skin is warm and dry.  Psychiatric: She has a normal mood and affect.   FHT: 150, moderate with 10x10 accels no decels noted until about 0428 Toco: every 5-6 mins  MAU Course  Procedures  MDM Patient refuses a pelvic exam at this time. Discussed rationale, and she  still refuses. Will get [place] to assess bleeding.  0440: FHR tracing shows prolonged decel. Dr. Corinna Capra notified.  [id number]: D/W Dr. Corinna Capra, reviewed tracing and [place] results. Dr. Corinna Capra is viewing FHR tracing at home. Will call Mikel Cella about possible transfer.  0500: Notified Dr. Corinna Capra of tracing. He is watching the tracing at home. Consent for OR, and continue to monitor.  Cervix: 1.5/70/-1 0520: Dr. Corinna Capra on the unit. Coordinating transfer. Dr. Hoy Morn accepts.  Assessment and  Plan  Subchorionic hemorrhage Transfer to [facility]   Mathis Bud 07/29/2016, 3:36 AM

## 2016-07-29 NOTE — MAU Provider Note (Signed)
CSN: [id number]  Arrival date and time: 07/29/16 [id number]   First Provider Initiated Contact with Patient 07/29/16 [phone]         Chief Complaint  Patient presents with  . Vaginal Bleeding   Teresa Barber is a 40 y.o. [id number] at [redacted]w[redacted]d who presents today with bleeding. She states that she started  Bleeding around 1900 yesterday, and then around 0200 she had another large gush. She also started feeling contractions around that time. She states that the baby has been moving normally.   Vaginal Bleeding  The patient's primary symptoms include pelvic pain and vaginal bleeding. This is a new problem. The current episode started yesterday. The problem occurs constantly. The problem has been gradually worsening. Pain severity now: 5-6/10  The problem affects both sides. She is pregnant. Associated symptoms include abdominal pain and nausea. Pertinent negatives include no chills, constipation, diarrhea, dysuria, fever, frequency, urgency or vomiting. The vaginal bleeding is typical of menses. She has been passing clots. She has not been passing tissue. Nothing aggravates the symptoms. She has tried nothing for the symptoms. Sexual activity: No intercourse in the last 24 hours.        Past Medical History:  Diagnosis Date  . Asthma   . Fibroid          Past Surgical History:  Procedure Laterality Date  . NO PAST SURGERIES           Family History  Problem Relation Age of Onset  . Asthma Mother   . Early death Mother         Social History  Substance Use Topics  . Smoking status: Never Smoker  . Smokeless tobacco: Never Used  . Alcohol use No    Allergies: No Known Allergies         Prescriptions Prior to Admission  Medication Sig Dispense Refill Last Dose  . acidophilus (RISAQUAD) CAPS capsule Take 1 capsule by mouth at bedtime.   07/12/2016 at Unknown time  . Ascorbic Acid (VITAMIN C) 1000 MG tablet Take 1,000 mg by mouth at bedtime.   07/12/2016 at Unknown  time  . ferrous sulfate 325 (65 FE) MG tablet Take 325 mg by mouth at bedtime.   07/12/2016 at Unknown time  . Prenatal Vit-Fe Fumarate-FA (PRENATAL MULTIVITAMIN) TABS tablet Take 1 tablet by mouth at bedtime.    07/12/2016 at Unknown time  . TURMERIC PO Take 1 capsule by mouth at bedtime.    07/12/2016 at Unknown time    Review of Systems  Constitutional: Negative for chills and fever.  Gastrointestinal: Positive for abdominal pain and nausea. Negative for constipation, diarrhea and vomiting.  Genitourinary: Positive for pelvic pain and vaginal bleeding. Negative for dysuria, frequency and urgency.   Physical Exam   Blood pressure 120/70, pulse 87, temperature 97.6 F (36.4 C), temperature source Oral, resp. rate 19, height 5\' 7"  (1.702 m), weight 177 lb (80.3 kg), last menstrual period 12/30/2015, SpO2 100 %.  Physical Exam  Nursing note and vitals reviewed. Constitutional: She is oriented to person, place, and time. She appears well-developed and well-nourished. No distress.  HENT:  Head: Normocephalic.  Cardiovascular: Normal rate.   Respiratory: Effort normal.  GI: Soft. There is no tenderness. There is no rebound.  Neurological: She is alert and oriented to person, place, and time.  Skin: Skin is warm and dry.  Psychiatric: She has a normal mood and affect.   FHT: 150, moderate with 10x10 accels no decels noted until about 0428 Toco:  every 5-6 mins  MAU Course  Procedures  MDM Patient refuses a pelvic exam at this time. Discussed rationale, and she still refuses. Will get [place] to assess bleeding.  0440: FHR tracing shows prolonged decel. Dr. Corinna Capra notified.  [id number]: D/W Dr. Corinna Capra, reviewed tracing and [place] results. Dr. Corinna Capra is viewing FHR tracing at home. Will call [facility] C[facility] about possible transfer.  0500: Notified Dr. Corinna Capra of tracing. He is watching the tracing at home. Consent for OR, and continue to monitor.  Cervix: 1.5/70/-1 0520: Dr. Corinna Capra on the unit.  Coordinating transfer. Dr. Hoy Morn accepts.  Assessment and Plan  Subchorionic hemorrhage Transfer to [facility]   Mathis Bud 07/29/2016, 3:36 AM   I have personally reviewed the EFM, spoke with the patient and I spoke with Dr Hoy Morn (MFM) at [facility] and he accepted the transfer.  The patient required IVF bolus and began Magnesium bolus for the preterm ctxs and no further fetal decels and now Cat 1 tracing for the last 30-40 minutes.  Bleeding is stable.  Cx Change noted.  I discussed the plan with patient, since the NICU is closed and delivery would require fetal transfer.  Pt agrees with transfer.  DL

## 2017-01-21 ENCOUNTER — Encounter (HOSPITAL_COMMUNITY): Payer: Self-pay

## 2017-08-30 LAB — OB RESULTS CONSOLE ANTIBODY SCREEN: ANTIBODY SCREEN: NEGATIVE

## 2017-08-30 LAB — OB RESULTS CONSOLE RPR: RPR: NONREACTIVE

## 2017-08-30 LAB — OB RESULTS CONSOLE RUBELLA ANTIBODY, IGM: RUBELLA: IMMUNE

## 2017-08-30 LAB — OB RESULTS CONSOLE HIV ANTIBODY (ROUTINE TESTING): HIV: NONREACTIVE

## 2017-08-30 LAB — OB RESULTS CONSOLE GC/CHLAMYDIA
CHLAMYDIA, DNA PROBE: NEGATIVE
GC PROBE AMP, GENITAL: NEGATIVE

## 2017-08-30 LAB — OB RESULTS CONSOLE ABO/RH: RH Type: POSITIVE

## 2017-08-30 LAB — OB RESULTS CONSOLE HEPATITIS B SURFACE ANTIGEN: Hepatitis B Surface Ag: NEGATIVE

## 2017-09-15 ENCOUNTER — Other Ambulatory Visit: Payer: Self-pay

## 2017-10-11 ENCOUNTER — Other Ambulatory Visit (HOSPITAL_COMMUNITY): Payer: Self-pay | Admitting: Obstetrics and Gynecology

## 2017-10-11 DIAGNOSIS — O09892 Supervision of other high risk pregnancies, second trimester: Secondary | ICD-10-CM

## 2017-10-11 DIAGNOSIS — O09212 Supervision of pregnancy with history of pre-term labor, second trimester: Secondary | ICD-10-CM

## 2017-10-11 DIAGNOSIS — Z8759 Personal history of other complications of pregnancy, childbirth and the puerperium: Secondary | ICD-10-CM

## 2017-10-11 DIAGNOSIS — O09522 Supervision of elderly multigravida, second trimester: Secondary | ICD-10-CM

## 2017-10-11 DIAGNOSIS — Z3A18 18 weeks gestation of pregnancy: Secondary | ICD-10-CM

## 2017-10-25 ENCOUNTER — Encounter (HOSPITAL_COMMUNITY): Payer: Self-pay | Admitting: *Deleted

## 2017-10-26 ENCOUNTER — Ambulatory Visit (HOSPITAL_COMMUNITY): Admission: RE | Admit: 2017-10-26 | Payer: BLUE CROSS/BLUE SHIELD | Source: Ambulatory Visit

## 2017-10-26 ENCOUNTER — Other Ambulatory Visit: Payer: Self-pay

## 2017-10-26 ENCOUNTER — Ambulatory Visit (HOSPITAL_COMMUNITY)
Admission: RE | Admit: 2017-10-26 | Discharge: 2017-10-26 | Disposition: A | Discharge status: Home / Self Care | Payer: BLUE CROSS/BLUE SHIELD | Source: Ambulatory Visit | Attending: Obstetrics and Gynecology | Admitting: Obstetrics and Gynecology

## 2017-10-26 ENCOUNTER — Encounter (HOSPITAL_COMMUNITY): Payer: Self-pay

## 2017-10-26 DIAGNOSIS — Z3A18 18 weeks gestation of pregnancy: Secondary | ICD-10-CM

## 2017-10-26 DIAGNOSIS — O09892 Supervision of other high risk pregnancies, second trimester: Secondary | ICD-10-CM

## 2017-10-26 DIAGNOSIS — O09522 Supervision of elderly multigravida, second trimester: Secondary | ICD-10-CM | POA: Insufficient documentation

## 2017-10-26 DIAGNOSIS — O3410 Maternal care for benign tumor of corpus uteri, unspecified trimester: Secondary | ICD-10-CM | POA: Diagnosis not present

## 2017-10-26 DIAGNOSIS — O09212 Supervision of pregnancy with history of pre-term labor, second trimester: Secondary | ICD-10-CM | POA: Insufficient documentation

## 2017-10-26 DIAGNOSIS — Z363 Encounter for antenatal screening for malformations: Secondary | ICD-10-CM | POA: Diagnosis not present

## 2017-10-26 DIAGNOSIS — O34219 Maternal care for unspecified type scar from previous cesarean delivery: Secondary | ICD-10-CM | POA: Diagnosis not present

## 2017-10-26 DIAGNOSIS — Z8759 Personal history of other complications of pregnancy, childbirth and the puerperium: Secondary | ICD-10-CM

## 2017-10-27 ENCOUNTER — Other Ambulatory Visit (HOSPITAL_COMMUNITY): Payer: Self-pay | Admitting: *Deleted

## 2017-10-27 DIAGNOSIS — Z8751 Personal history of pre-term labor: Secondary | ICD-10-CM

## 2017-11-23 ENCOUNTER — Other Ambulatory Visit (HOSPITAL_COMMUNITY): Payer: Self-pay | Admitting: Obstetrics and Gynecology

## 2017-11-23 ENCOUNTER — Other Ambulatory Visit (HOSPITAL_COMMUNITY): Payer: Self-pay | Admitting: *Deleted

## 2017-11-23 ENCOUNTER — Encounter (HOSPITAL_COMMUNITY): Payer: Self-pay

## 2017-11-23 ENCOUNTER — Ambulatory Visit (HOSPITAL_COMMUNITY)
Admission: RE | Admit: 2017-11-23 | Discharge: 2017-11-23 | Disposition: A | Discharge status: Home / Self Care | Payer: BLUE CROSS/BLUE SHIELD | Source: Ambulatory Visit | Attending: Obstetrics and Gynecology | Admitting: Obstetrics and Gynecology

## 2017-11-23 DIAGNOSIS — O09212 Supervision of pregnancy with history of pre-term labor, second trimester: Secondary | ICD-10-CM | POA: Insufficient documentation

## 2017-11-23 DIAGNOSIS — Z362 Encounter for other antenatal screening follow-up: Secondary | ICD-10-CM

## 2017-11-23 DIAGNOSIS — Z3A22 22 weeks gestation of pregnancy: Secondary | ICD-10-CM | POA: Diagnosis not present

## 2017-11-23 DIAGNOSIS — O34219 Maternal care for unspecified type scar from previous cesarean delivery: Secondary | ICD-10-CM

## 2017-11-23 DIAGNOSIS — Z8751 Personal history of pre-term labor: Secondary | ICD-10-CM

## 2017-11-23 DIAGNOSIS — D259 Leiomyoma of uterus, unspecified: Secondary | ICD-10-CM

## 2017-11-23 DIAGNOSIS — O3412 Maternal care for benign tumor of corpus uteri, second trimester: Secondary | ICD-10-CM | POA: Insufficient documentation

## 2017-11-23 DIAGNOSIS — O09522 Supervision of elderly multigravida, second trimester: Secondary | ICD-10-CM

## 2017-11-23 DIAGNOSIS — O09529 Supervision of elderly multigravida, unspecified trimester: Secondary | ICD-10-CM

## 2017-12-21 ENCOUNTER — Encounter (HOSPITAL_COMMUNITY): Payer: Self-pay

## 2017-12-21 ENCOUNTER — Other Ambulatory Visit (HOSPITAL_COMMUNITY): Payer: Self-pay | Admitting: Maternal and Fetal Medicine

## 2017-12-21 ENCOUNTER — Other Ambulatory Visit (HOSPITAL_COMMUNITY): Payer: Self-pay | Admitting: *Deleted

## 2017-12-21 ENCOUNTER — Ambulatory Visit (HOSPITAL_COMMUNITY)
Admission: RE | Admit: 2017-12-21 | Discharge: 2017-12-21 | Disposition: A | Discharge status: Home / Self Care | Payer: BLUE CROSS/BLUE SHIELD | Source: Ambulatory Visit | Attending: Obstetrics and Gynecology | Admitting: Obstetrics and Gynecology

## 2017-12-21 DIAGNOSIS — Z3A26 26 weeks gestation of pregnancy: Secondary | ICD-10-CM

## 2017-12-21 DIAGNOSIS — O34219 Maternal care for unspecified type scar from previous cesarean delivery: Secondary | ICD-10-CM | POA: Diagnosis not present

## 2017-12-21 DIAGNOSIS — O3412 Maternal care for benign tumor of corpus uteri, second trimester: Secondary | ICD-10-CM

## 2017-12-21 DIAGNOSIS — O09212 Supervision of pregnancy with history of pre-term labor, second trimester: Secondary | ICD-10-CM | POA: Insufficient documentation

## 2017-12-21 DIAGNOSIS — O09219 Supervision of pregnancy with history of pre-term labor, unspecified trimester: Secondary | ICD-10-CM

## 2017-12-21 DIAGNOSIS — Z362 Encounter for other antenatal screening follow-up: Secondary | ICD-10-CM

## 2017-12-21 DIAGNOSIS — O09899 Supervision of other high risk pregnancies, unspecified trimester: Secondary | ICD-10-CM

## 2017-12-21 DIAGNOSIS — O09529 Supervision of elderly multigravida, unspecified trimester: Secondary | ICD-10-CM

## 2017-12-21 DIAGNOSIS — O09522 Supervision of elderly multigravida, second trimester: Secondary | ICD-10-CM | POA: Diagnosis not present

## 2017-12-21 DIAGNOSIS — D259 Leiomyoma of uterus, unspecified: Secondary | ICD-10-CM

## 2017-12-21 DIAGNOSIS — O3410 Maternal care for benign tumor of corpus uteri, unspecified trimester: Secondary | ICD-10-CM | POA: Diagnosis not present

## 2018-01-05 ENCOUNTER — Inpatient Hospital Stay (HOSPITAL_COMMUNITY)
Admission: AD | Admit: 2018-01-05 | Discharge: 2018-01-06 | DRG: 833 | Disposition: A | Discharge status: Home / Self Care | Payer: BLUE CROSS/BLUE SHIELD | Source: Ambulatory Visit | Attending: Obstetrics and Gynecology | Admitting: Obstetrics and Gynecology

## 2018-01-05 ENCOUNTER — Other Ambulatory Visit: Payer: Self-pay

## 2018-01-05 ENCOUNTER — Encounter (HOSPITAL_COMMUNITY): Payer: Self-pay

## 2018-01-05 DIAGNOSIS — O09219 Supervision of pregnancy with history of pre-term labor, unspecified trimester: Secondary | ICD-10-CM

## 2018-01-05 DIAGNOSIS — Z3A28 28 weeks gestation of pregnancy: Secondary | ICD-10-CM | POA: Diagnosis not present

## 2018-01-05 DIAGNOSIS — O4700 False labor before 37 completed weeks of gestation, unspecified trimester: Secondary | ICD-10-CM

## 2018-01-05 DIAGNOSIS — O34219 Maternal care for unspecified type scar from previous cesarean delivery: Secondary | ICD-10-CM

## 2018-01-05 DIAGNOSIS — O479 False labor, unspecified: Secondary | ICD-10-CM

## 2018-01-05 DIAGNOSIS — D259 Leiomyoma of uterus, unspecified: Secondary | ICD-10-CM | POA: Diagnosis present

## 2018-01-05 DIAGNOSIS — O3413 Maternal care for benign tumor of corpus uteri, third trimester: Secondary | ICD-10-CM | POA: Diagnosis present

## 2018-01-05 DIAGNOSIS — O09523 Supervision of elderly multigravida, third trimester: Secondary | ICD-10-CM

## 2018-01-05 DIAGNOSIS — O09899 Supervision of other high risk pregnancies, unspecified trimester: Secondary | ICD-10-CM

## 2018-01-05 HISTORY — DX: Herpesviral infection, unspecified: B00.9

## 2018-01-05 LAB — URINALYSIS, ROUTINE W REFLEX MICROSCOPIC
Bilirubin Urine: NEGATIVE
GLUCOSE, UA: NEGATIVE mg/dL
HGB URINE DIPSTICK: NEGATIVE
Ketones, ur: NEGATIVE mg/dL
LEUKOCYTES UA: NEGATIVE
Nitrite: NEGATIVE
Protein, ur: NEGATIVE mg/dL
SPECIFIC GRAVITY, URINE: 1.004 — AB (ref 1.005–1.030)
pH: 7 (ref 5.0–8.0)

## 2018-01-05 LAB — FETAL FIBRONECTIN: FETAL FIBRONECTIN: NEGATIVE

## 2018-01-05 LAB — CBC
HEMATOCRIT: 36.6 % (ref 36.0–46.0)
HEMOGLOBIN: 12.4 g/dL (ref 12.0–15.0)
MCH: 29 pg (ref 26.0–34.0)
MCHC: 33.9 g/dL (ref 30.0–36.0)
MCV: 85.5 fL (ref 78.0–100.0)
Platelets: 181 10*3/uL (ref 150–400)
RBC: 4.28 MIL/uL (ref 3.87–5.11)
RDW: 13.9 % (ref 11.5–15.5)
WBC: 12.3 10*3/uL — ABNORMAL HIGH (ref 4.0–10.5)

## 2018-01-05 LAB — TYPE AND SCREEN
ABO/RH(D): O POS
ANTIBODY SCREEN: NEGATIVE

## 2018-01-05 MED ORDER — LACTATED RINGERS IV SOLN
INTRAVENOUS | Status: DC
  Administered 2018-01-05 – 2018-01-06 (×3): via INTRAVENOUS

## 2018-01-05 MED ORDER — DOCUSATE SODIUM 100 MG PO CAPS
100.0000 mg | ORAL_CAPSULE | Freq: Every day | ORAL | Status: DC
  Filled 2018-01-05: qty 1

## 2018-01-05 MED ORDER — ACETAMINOPHEN 325 MG PO TABS: 650.0000 mg | ORAL_TABLET | ORAL | Status: DC | PRN

## 2018-01-05 MED ORDER — PRENATAL MULTIVITAMIN CH
1.0000 | ORAL_TABLET | Freq: Every day | ORAL | Status: DC
  Administered 2018-01-06: 1 via ORAL
  Filled 2018-01-05: qty 1

## 2018-01-05 MED ORDER — MAGNESIUM SULFATE 40 G IN LACTATED RINGERS - SIMPLE
2.0000 g/h | INTRAVENOUS | Status: DC
  Administered 2018-01-06: 2 g/h via INTRAVENOUS
  Filled 2018-01-05: qty 40
  Filled 2018-01-05: qty 500

## 2018-01-05 MED ORDER — MAGNESIUM SULFATE BOLUS VIA INFUSION
4.0000 g | Freq: Once | INTRAVENOUS | Status: AC
  Administered 2018-01-05: 4 g via INTRAVENOUS
  Filled 2018-01-05: qty 500

## 2018-01-05 MED ORDER — BETAMETHASONE SOD PHOS & ACET 6 (3-3) MG/ML IJ SUSP
12.0000 mg | INTRAMUSCULAR | Status: AC
  Administered 2018-01-05 – 2018-01-06 (×2): 12 mg via INTRAMUSCULAR
  Filled 2018-01-05 (×3): qty 2

## 2018-01-05 MED ORDER — CALCIUM CARBONATE ANTACID 500 MG PO CHEW: 2.0000 | CHEWABLE_TABLET | ORAL | Status: DC | PRN

## 2018-01-05 MED ORDER — ZOLPIDEM TARTRATE 5 MG PO TABS: 5.0000 mg | ORAL_TABLET | Freq: Every evening | ORAL | Status: DC | PRN

## 2018-01-05 NOTE — MAU Provider Note (Signed)
Chief Complaint:  Contractions   First Provider Initiated Contact with Patient 01/05/18 [phone]     HPI: Teresa Barber is a 42 y.o. [id number] at 70w2dwho presents to maternity admissions reporting contractions since 0530.  She reports good fetal movement, denies LOF, vaginal bleeding, vaginal itching/burning, urinary symptoms, h/a, dizziness, n/v, diarrhea, constipation or fever/chills.  Has a history of preterm cesarean delivery for abruption with last baby (29 weeks)     Abdominal Pain  This is a new problem. The current episode started today. The problem occurs intermittently. The problem has been unchanged. The pain is located in the suprapubic region, LLQ and RLQ. The quality of the pain is cramping. The abdominal pain does not radiate. Pertinent negatives include no constipation, diarrhea, dysuria, fever or headaches. Nothing aggravates the pain. The pain is relieved by nothing. She has tried nothing for the symptoms.    RN Note: Pt presents to MAU c/o ctxs that have been occurring however she started paying attention to the pain around 0526. Pt states her ctx are every 12min. Pt denies LOF or bleeding and reports +FM. Pt states her stomach "hurts" pt reports [place] yesterday and everything was "good."     Past Medical History: Past Medical History:  Diagnosis Date  . Asthma   . Fibroid   . HSV-2 infection     Past obstetric history: OB History  Gravida Para Term Preterm AB Living  7 3 2 1 3 3   SAB TAB Ectopic Multiple Live Births  2 1     3     # Outcome Date GA Lbr Len/2nd Weight Sex Delivery Anes PTL Lv  7 Current           6 Preterm 07/29/16 [redacted]w[redacted]d   F CS-LVertical Spinal  LIV     Complications: Abruptio Placenta  5 SAB           4 SAB           3 Term      Vag-Spont   LIV  2 Term      Vag-Spont   LIV  1 TAB             Past Surgical History: Past Surgical History:  Procedure Laterality Date  . CESAREAN SECTION    . NO PAST SURGERIES      Family History: Family History   Problem Relation Age of Onset  . Asthma Mother   . Early death Mother     Social History: Social History   Tobacco Use  . Smoking status: Never Smoker  . Smokeless tobacco: Never Used  Substance Use Topics  . Alcohol use: No  . Drug use: No    Allergies: No Known Allergies  Meds:  Medications Prior to Admission  Medication Sig Dispense Refill Last Dose  . acidophilus (RISAQUAD) CAPS capsule Take 1 capsule by mouth at bedtime.   Taking  . Ascorbic Acid (VITAMIN C) 1000 MG tablet Take 1,000 mg by mouth at bedtime.   Taking  . ferrous sulfate 325 (65 FE) MG tablet Take 325 mg by mouth at bedtime.   Not Taking  . Prenatal Vit-Fe Fumarate-FA (PRENATAL MULTIVITAMIN) TABS tablet Take 1 tablet by mouth at bedtime.    Taking  . TURMERIC PO Take 1 capsule by mouth at bedtime.    Not Taking    I have reviewed patient's Past Medical Hx, Surgical Hx, Family Hx, Social Hx, medications and allergies.   ROS:  Review of Systems  Constitutional: Negative  for fever.  Gastrointestinal: Positive for abdominal pain. Negative for constipation and diarrhea.  Genitourinary: Negative for dysuria.  Neurological: Negative for headaches.   Other systems negative  Physical Exam   Patient Vitals for the past 24 hrs:  BP Temp Temp src Pulse Resp Height Weight  01/05/18 0702 (!) 113/51 (!) 97.2 F (36.2 C) Axillary 83 17 5\' 7"  (1.702 m) 172 lb 1.9 oz (78.1 kg)   Constitutional: Well-developed, well-nourished female in no acute distress.  Cardiovascular: normal rate and rhythm Respiratory: normal effort, clear to auscultation bilaterally GI: Abd soft, non-tender, gravid appropriate for gestational age.   No rebound or guarding. MS: Extremities nontender, no edema, normal ROM Neurologic: Alert and oriented x 4.  GU: Neg CVAT.  PELVIC EXAM: Cervix pink, visually closed, without lesion, copious white creamy discharge, vaginal walls and external genitalia normal Bimanual exam:   Fingertip/20%/High  FHT:  Baseline 140 , moderate variability, accelerations present, no decelerations Contractions: q 2-4 mins Irregular     Labs: Results for orders placed or performed during the hospital encounter of 01/05/18 (from the past 24 hour(s))  Urinalysis, Routine w reflex microscopic     Status: Abnormal   Collection Time: 01/05/18  7:06 AM  Result Value Ref Range   Color, Urine YELLOW YELLOW   APPearance CLEAR CLEAR   Specific Gravity, Urine 1.004 (L) 1.005 - 1.030   pH 7.0 5.0 - 8.0   Glucose, UA NEGATIVE NEGATIVE mg/dL   Hgb urine dipstick NEGATIVE NEGATIVE   Bilirubin Urine NEGATIVE NEGATIVE   Ketones, ur NEGATIVE NEGATIVE mg/dL   Protein, ur NEGATIVE NEGATIVE mg/dL   Nitrite NEGATIVE NEGATIVE   Leukocytes, UA NEGATIVE NEGATIVE      Imaging:  [place] Mfm Ob Follow Up  Result Date: 12/21/2017 ----------------------------------------------------------------------  OBSTETRICS REPORT                      (Signed Final 12/21/2017 02:30 pm) ---------------------------------------------------------------------- Patient Info  ID #:       [id number]                          D.O.B.:  11/14/1975 (41 yrs)  Name:       Teresa Barber                  Visit Date: 12/21/2017 01:24 pm ---------------------------------------------------------------------- Performed By  Performed By:     Corky Crafts             Ref. Address:     [place]  [place]  Attending:        Wende Mott MD     Location:         [facility]  Referred By:      Everlene Farrier                    MD ---------------------------------------------------------------------- Orders   #  Description                                 Code   1  [place] MFM OB FOLLOW UP                         [phone]   ----------------------------------------------------------------------   #  Ordered By               Order #        Accession #    Episode #   1  Jolyn Lent           [id number]      [id number]     [id number]  ---------------------------------------------------------------------- Indications   [redacted] weeks gestation of pregnancy                Z3A.26   Advanced maternal age multigravida 47+,        O26.522   second trimester (low risk NIPS)   Uterine fibroids                               O34.10   History of cesarean delivery, currently        O73.219   pregnant   Poor obstetric history: Previous preterm       O09.219   delivery, antepartum (abruption at 58wks)   Encounter for other antenatal screening        Z36.2   follow-up  ---------------------------------------------------------------------- OB History  Gravidity:    7         Term:   2        Prem:   1        SAB:   2  TOP:          1        Living:  3 ---------------------------------------------------------------------- Fetal Evaluation  Num Of Fetuses:     1  Fetal Heart         140  Rate(bpm):  Cardiac Activity:   Observed  Presentation:       Cephalic  Placenta:           Anterior, above cervical os  P. Cord Insertion:  Previously Visualized  Amniotic Fluid  AFI FV:      Subjectively within normal limits                              Largest Pocket(cm)                              7.01 ---------------------------------------------------------------------- Biometry  BPD:      68.8  mm     G. Age:  27w 4d         87  %    CI:        72.45   %    70 - 86  FL/HC:      18.0   %    18.6 - 20.4  HC:      257.1  mm     G. Age:  27w 6d         82  %    HC/AC:      1.22        1.04 - 1.22  AC:      210.9  mm     G. Age:  25w 4d         26  %    FL/BPD:     67.3   %    71 - 87  FL:       46.3  mm     G. Age:  25w 3d         17  %    FL/AC:      22.0   %    20 - 24  HUM:      43.2  mm     G. Age:  25w 5d          38  %  Est. FW:     862  gm    1 lb 14 oz      48  % ---------------------------------------------------------------------- Gestational Age  LMP:           26w 1d        Date:  06/21/17                 EDD:   03/28/18  U/S Today:     26w 4d                                        EDD:   03/25/18  Best:          26w 1d     Det. By:  LMP  (06/21/17)          EDD:   03/28/18 ---------------------------------------------------------------------- Anatomy  Cranium:               Appears normal         Aortic Arch:            Previously seen  Cavum:                 Appears normal         Ductal Arch:            Previously seen  Ventricles:            Appears normal         Diaphragm:              Previously seen  Choroid Plexus:        Previously seen        Stomach:                Appears normal, left                                                                        sided  Cerebellum:  Previously seen        Abdomen:                Appears normal  Posterior Fossa:       Previously seen        Abdominal Wall:         Previously seen  Nuchal Fold:           Previously seen        Cord Vessels:           Previously seen  Face:                  Orbits and profile     Kidneys:                Appear normal                         previously seen  Lips:                  Previously seen        Bladder:                Appears normal  Thoracic:              Appears normal         Spine:                  Previously seen  Heart:                 Appears normal         Upper Extremities:      Previously seen                         (4CH, axis, and situs  RVOT:                  Appears normal         Lower Extremities:      Previously seen  LVOT:                  Appears normal  Other:  Fetus appears to be a female.  Heels prev visualized. ---------------------------------------------------------------------- Cervix Uterus Adnexa  Cervix  Length:           3.96  cm.  Uterus  Multiple fibroids noted, see table below.  Left  Ovary  Previously seen.  Right Ovary  Previously seen ---------------------------------------------------------------------- Myomas   Site                     L(cm)      W(cm)      D(cm)      Location   Anterior                 4          3.9        2.5   Right                    6.4        7.1        3.6   Post, RT                 5.2        5.5        4.6  ----------------------------------------------------------------------   Blood Flow  RI        PI       Comments  ---------------------------------------------------------------------- Impression  Indication: [age] yr old G3T5176 at [redacted]w[redacted]d with uterine  fibroids and history of abruption for fetal growth.  Findings:  1. Single intrauterine pregnancy with normal cardiac activity.  2. Estimated fetal weight is in the 48th%.  3. Anterior placenta without evidence of previa.  4. Normal amniotic fluid volume.  5. Normal transabdominal cervical length.  6. The limited anatomy survey is normal.  7. Again seen are multiple fibroids the largest measures 7cm. ---------------------------------------------------------------------- Recommendations  1. Appropriate fetal growth.  2. Advanced maternal age:  - previously counseled  - low risk cell free fetal DNA  - recommend serial fetal growth  - recommend start antenatal testing by 36 weeks  - recommend start fetal kick counts at 28 weeks  - recommend delivery by EDD  3. Fibroids:  - recommend serial fetal growth  4. History of abruption:  - recommend monitor for signs/symptoms of abruption ----------------------------------------------------------------------                Wende Mott, MD Electronically Signed Final Report   12/21/2017 02:30 pm ----------------------------------------------------------------------   MAU Course/MDM: I have ordered labs and reviewed results.  NST reviewed and found to be reassuring. Consult Dr Matthew Saras with presentation, exam findings and test results.  He recommends  admission for MgSO4 and Betamethasone  Assessment: Single IUP at [redacted]w[redacted]d Preterm Labor  Plan: Admit to antenatal Fetal fibronectin sent MgSO4 Betamethasone MD to follow  Hansel Feinstein CNM, MSN Certified Nurse-Midwife 01/05/2018 7:28 AM

## 2018-01-05 NOTE — MAU Note (Signed)
Pt presents to MAU c/o ctxs that have been occurring however she started paying attention to the pain around 0526. Pt states her ctx are every 19min. Pt denies LOF or bleeding and reports +FM. Pt states her stomach "hurts" pt reports [place] yesterday and everything was "good."

## 2018-01-05 NOTE — H&P (Signed)
Teresa Barber, Teresa Barber MEDICAL RECORD [id number] ACCOUNT [id number] DATE OF BIRTH:01/09/1976 FACILITY: [facility] LOCATION: [id number] PHYSICIAN:Sheffield Hawker Garry Heater, MD  HISTORY AND PHYSICAL  DATE OF ADMISSION:  01/05/2018  CHIEF COMPLAINT:  Preterm contractions.  HISTORY OF PRESENT ILLNESS:  A 42 year old G38-P2-1-3.  The patient has had a normal panorama with this pregnancy.  She also had a prior preterm delivery.  First pregnancy of four, 7 pounds 8 ounces; second in 2006, 7 pounds 8 ounces; 07/2016 had a  primary CS for preterm labor and abruption, delivering a 2 pound 8 ounce female; that was at [facility].  She is O positive with this pregnancy.  She last had an ultrasound in our office 05/16 that showed vertex, AFI normal.  There were fibroids  that measured 3.8, 5.5, 6.9, and 2.8.  She presents to the MAU complaining of some contractions.  Her cervix was felt to be fingertip and otherwise long.  FFN was done, which is pending.  Because of her history, will be admitted for tocolysis and betamethasone series.  We will plan to repeat  ultrasound tomorrow morning also.  ALLERGIES:  None.  OBSTETRICAL HISTORY:  Already listed.  REVIEW OF SYSTEMS:  Significant for history of HSV for her family.   SOCIAL HISTORY:  Please see the [employer] form for details.  PHYSICAL EXAMINATION: VITAL SIGNS:  Temperature 98.2, blood pressure 110/70. HEENT:  Unremarkable. NECK:  Supple, without masses. LUNGS:  Clear. CARDIOVASCULAR:  Regular rate and rhythm without murmurs, rubs or gallops. BREASTS:  Not examined. ABDOMEN:  A 28 cm fundal height. PELVIC:  Uterus was soft, nontender.  Fetal heart rate 142.  Cervix is fingertip and long.  IMPRESSION: 1.  28-2/7. 2.  History of preterm delivery/abruption, primary cesarean section in 2016. 3.  Fibroids. 4.  Preterm contractions.  PLAN:  Magnesium sulfate, betamethasone series.  Followup ultrasound in a.m.  LN/NUANCE  D:01/05/2018 T:01/05/2018  JOB:[id number]

## 2018-01-05 NOTE — H&P (Signed)
Teresa Barber  DICTATION # [id number]    CSN# [id number]   Margarette Asal, MD 01/05/2018 9:39 AM

## 2018-01-06 ENCOUNTER — Inpatient Hospital Stay (HOSPITAL_COMMUNITY): Payer: BLUE CROSS/BLUE SHIELD

## 2018-01-06 IMAGING — US US MFM OB FOLLOW-UP
1 series · 14 of 28 positions shown · non-contrast
Comparison: none

[Series 1: us mfm ob follow-up · 38 acquisitions, 14 frames shown]
[im 2/38]
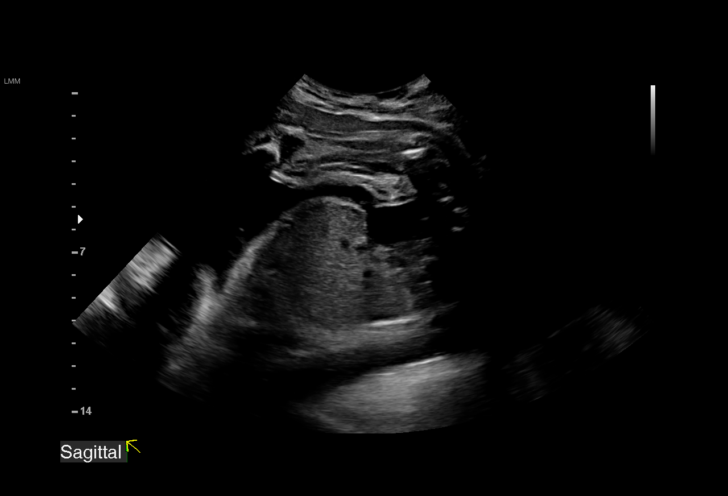
[im 5/38]
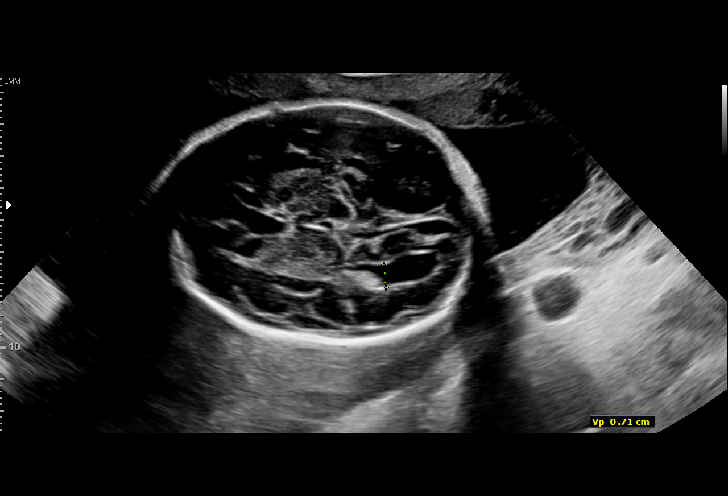
[im 7/38]
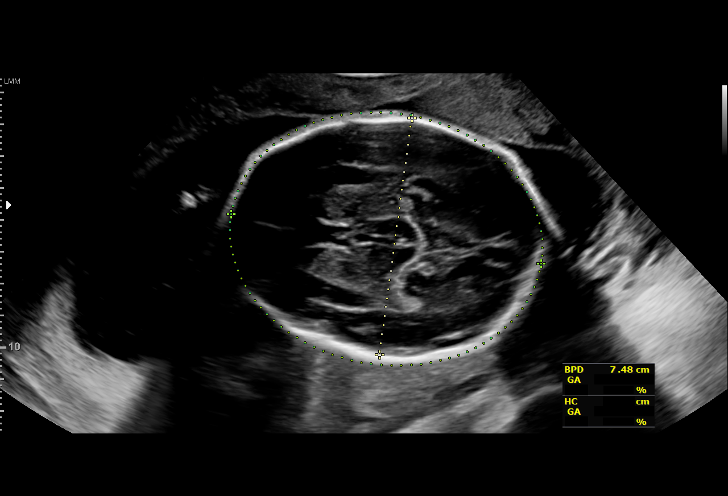
[im 10/38]
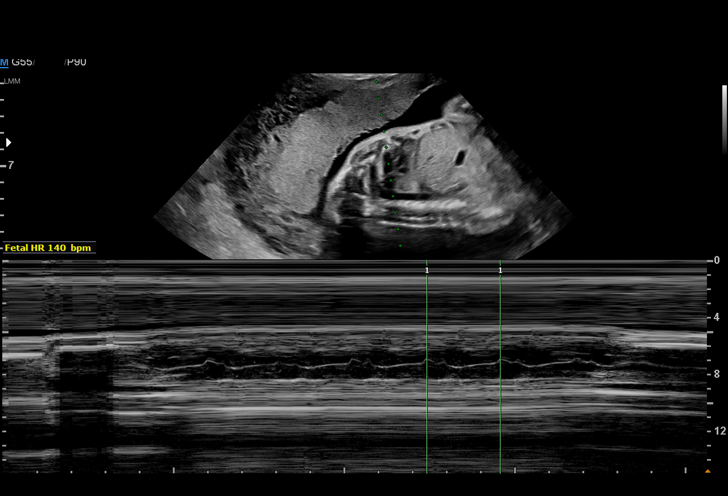
[im 13/38]
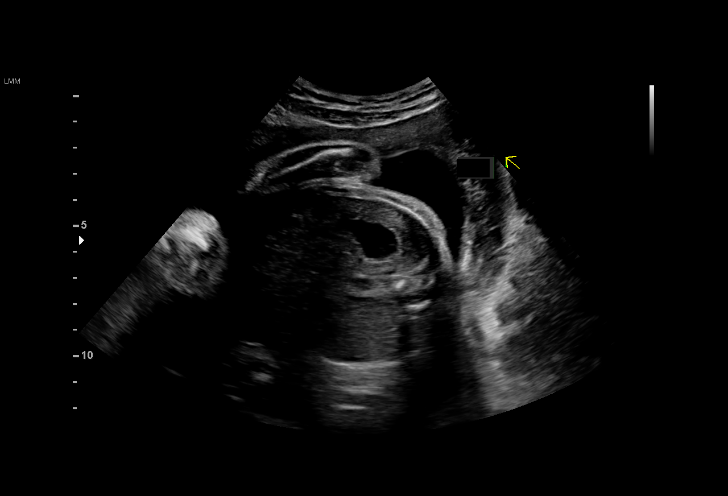
[im 16/38]
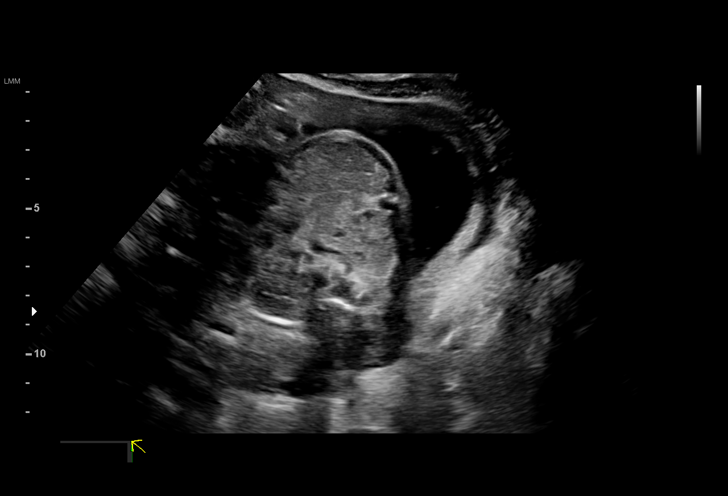
[im 18/38]
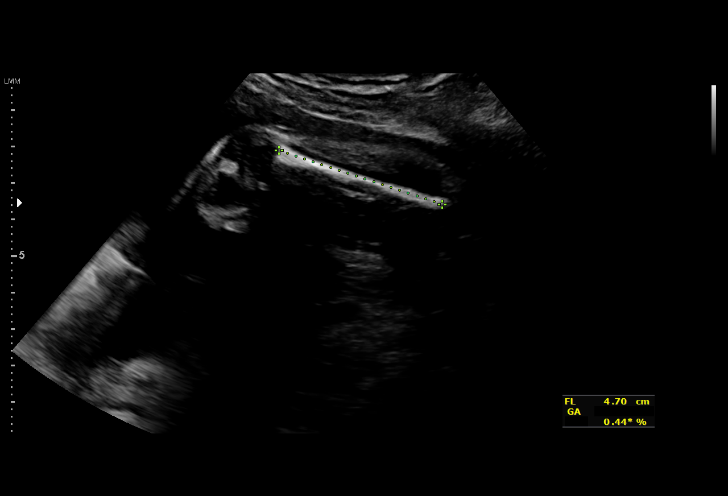
[im 21/38]
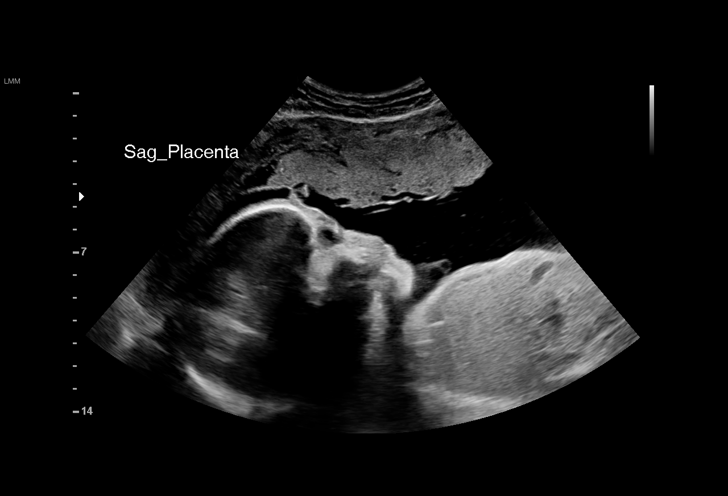
[im 24/38]
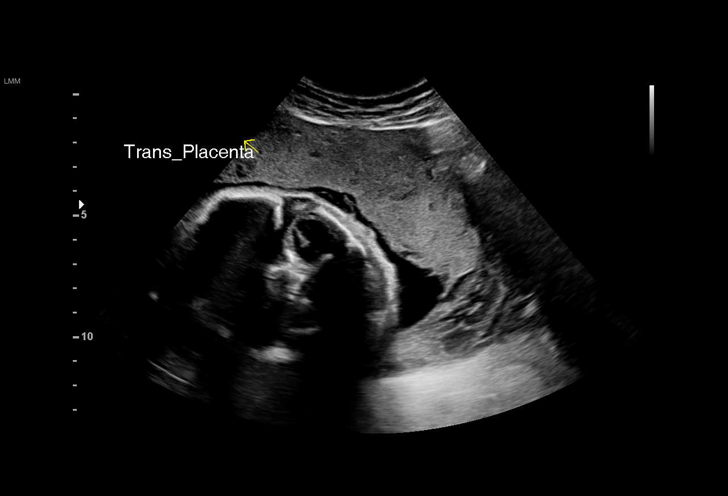
[im 27/38]
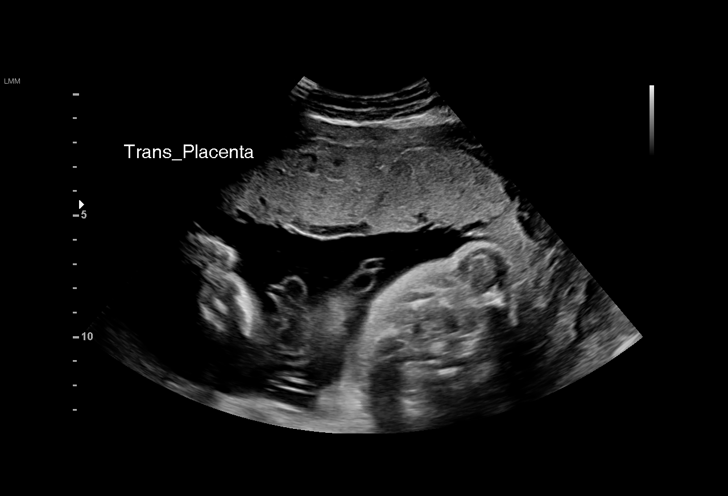
[im 29/38]
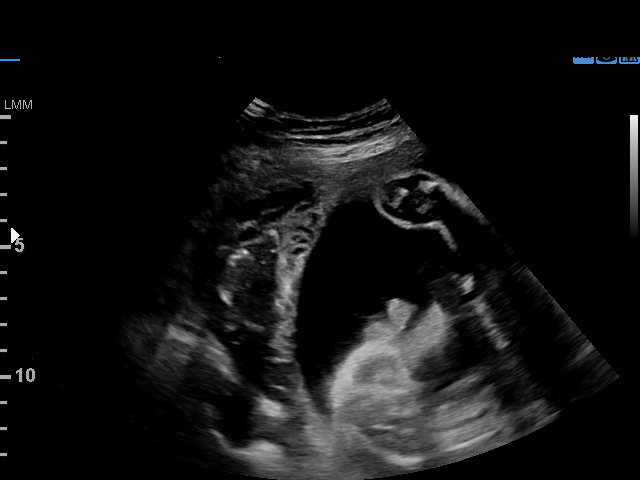
[im 32/38]
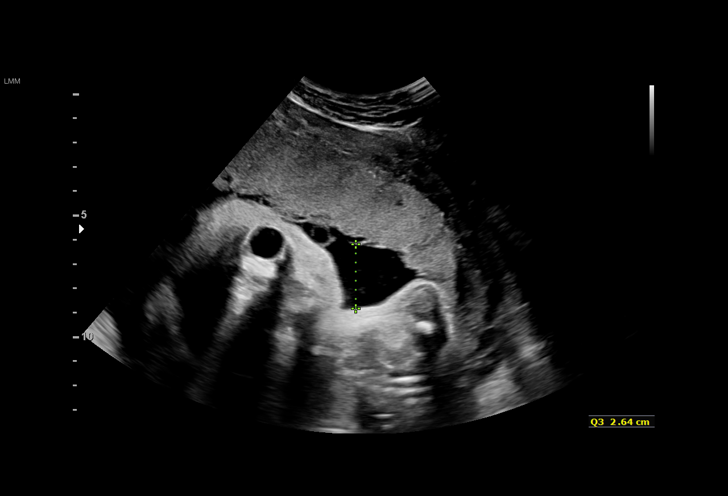
[im 35/38]
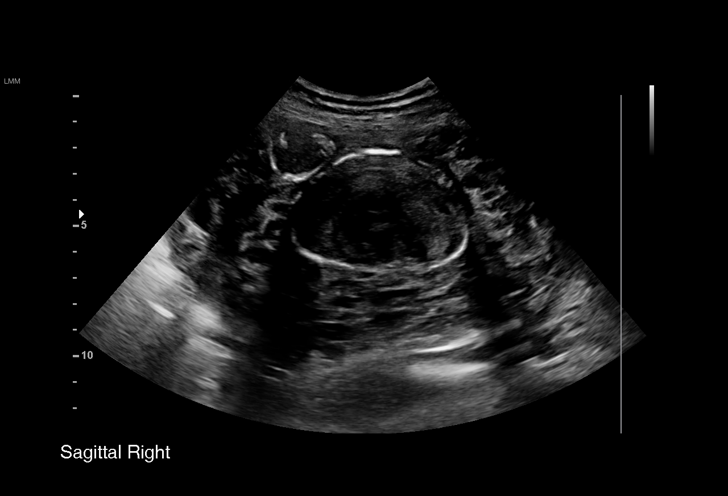
[im 38/38]
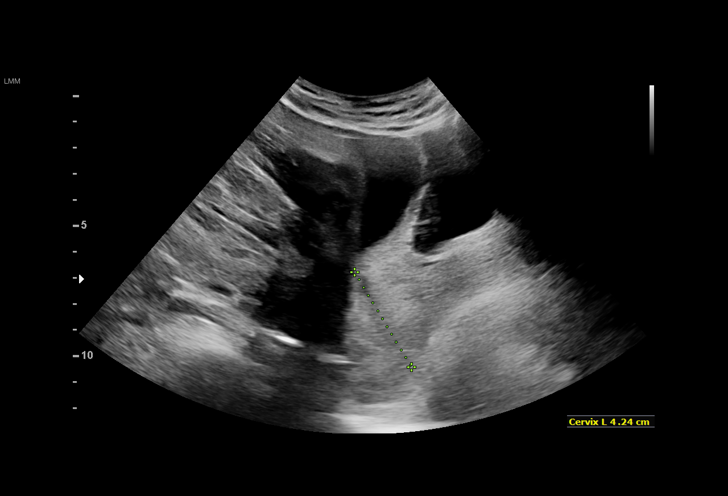

[14 of 28 positions shown; findings below may reference images not displayed]

Attending:        SHALLEY       Secondary Phy.:    3rd Nursing- [employer]
OB

1  SHALLEY          [PHONE_NUMBER]      [PHONE_NUMBER]     [PHONE_NUMBER]
Indications

28 weeks gestation of pregnancy
Preterm contractions                           [ZG]
Advanced maternal age multigravida 35+,        [ZG]
third trimester; low risk NIPS
Uterine fibroids affecting pregnancy in third  O34.13, [ZG]
trimester, antepartum
History of cesarean delivery, currently        [ZG]
pregnant
Poor obstetric history: Previous preterm       [ZG]
delivery, antepartum (abruption at [ZG])
OB History

Gravidity:    7         Term:   2        Prem:   1        SAB:   2
TOP:          1         Living: 3
Fetal Evaluation

Num Of Fetuses:     1
Fetal Heart         140
Rate(bpm):
Cardiac Activity:   Observed
Presentation:       Frank breech
Placenta:           Anterior, above cervical os
P. Cord Insertion:  Previously Visualized
Amniotic Fluid
AFI FV:      Subjectively upper-normal

AFI Sum(cm)     %Tile       Largest Pocket(cm)
21.59           88

RUQ(cm)       RLQ(cm)       LUQ(cm)        LLQ(cm)
7.97
Biometry

BPD:      74.7  mm     G. Age:  30w 0d         84  %    CI:        72.65   %    70 - 86
FL/HC:       17.2  %    18.8 -
HC:      278.7  mm     G. Age:  30w 3d         80  %    HC/AC:       1.11       1.05 -
AC:      251.2  mm     G. Age:  29w 2d         70  %    FL/BPD:      64.3  %    71 - 87
FL:         48  mm     G. Age:  26w 1d        < 3  %    FL/AC:       19.1  %    20 - 24

Est. FW:    [ZG]   gm    2 lb 11 oz     51  %
Gestational Age

LMP:           28w 3d        Date:  [DATE]                 EDD:   [DATE]
U/S Today:     29w 0d                                        EDD:   [DATE]
Best:          28w 3d     Det. By:  LMP  ([DATE])          EDD:   [DATE]
Anatomy

Cranium:               Appears normal         Aortic Arch:            Previously seen
Cavum:                 Appears normal         Ductal Arch:            Previously seen
Ventricles:            Appears normal         Diaphragm:              Previously seen
Choroid Plexus:        Previously seen        Stomach:                Appears normal, left
sided
Cerebellum:            Previously seen        Abdomen:                Appears normal
Posterior Fossa:       Previously seen        Abdominal Wall:         Previously seen
Nuchal Fold:           Previously seen        Cord Vessels:           Previously seen
Face:                  Orbits and profile     Kidneys:                Appear normal
previously seen
Lips:                  Previously seen        Bladder:                Appears normal
Thoracic:              Appears normal         Spine:                  Previously seen
Heart:                 Previously seen        Upper Extremities:      Previously seen
RVOT:                  Previously seen        Lower Extremities:      Previously seen
LVOT:                  Previously seen
Cervix Uterus Adnexa

Cervix
Length:            3.8  cm.
Normal appearance by transabdominal scan.

Uterus
Multiple fibroids.  Largest masures 6.9 cm.
Comments

Patient's scheduled outpatient followup US in MFM
scheduled for [REDACTED] has been cancelled due to proximity
to today's study.  Please call MFM to schedule follow-up US
in approximately four weeks, if desired.
Impression

SIUP at 28+3 weeks
Normal interval anatomy; anatomic survey complete
High normal amniotic fluid volume
Appropriate interval growth with EFW at the 51st %tile
TA views of cervix: normal length without funneling
Fibroid uterus
Recommendations

Continue serial growth scans

## 2018-01-06 NOTE — Discharge Summary (Signed)
Physician Discharge Summary  Patient ID: Teresa Barber MRN: [id number] DOB/AGE: [birth year] 42 y.o.  Admit date: 01/05/2018 Discharge date: 01/06/2018  Admission Diagnoses:[redacted]w[redacted]d/ PTL   Discharge Diagnoses: same Active Problems:   Fibroid uterus   Preterm labor in third trimester   Discharged Condition: good  Hospital Course: adm for MgSO4 + BMZ series with normal cx length, stable day of D/C with f/ US>>normal cx length  Consults: None  Significant Diagnostic Studies: labs: No results found for this or any previous visit (from the past 24 hour(s)).  Treatments: MgSO4 + BMZ  Discharge Exam: Blood pressure 122/63, pulse 91, temperature 98.3 F (36.8 C), temperature source Oral, resp. rate 18, height 5\' 7"  (1.702 m), weight 172 lb 1.9 oz (78.1 kg), last menstrual period 06/21/2017, SpO2 100 %, unknown if currently breastfeeding. fundus soft, FHR 142  Disposition: Discharge disposition: 01-Home or Self Care        Allergies as of 01/06/2018   No Known Allergies     Medication List    STOP taking these medications   vitamin C 1000 MG tablet     TAKE these medications   prenatal multivitamin Tabs tablet Take 1 tablet by mouth at bedtime.      Follow-up Information    [place] Schedule an appointment as soon as possible for a visit in 4 day(s).   Contact information: [address] [place] [address] [phone]           Signed: Margarette Asal 01/06/2018, 3:29 PM

## 2018-01-06 NOTE — Progress Notes (Signed)
Discharge instructions reviewed with pt. Discussed that pt should contact MD if she feels 6 or more ctx in the course of an hour and if she has any bleeding or leaking of fluids. Hand-wrote these instructions on discharge papers per pt request. Pt verbalizes understanding and has no questions or concerns at this time. IV taken out and pt tolerated well. Pt discharged from hospital in stable condition.

## 2018-01-06 NOTE — Progress Notes (Signed)
Attempted to call MFM doctor twice to find out final reading on ultrasound, but unable to leave voicemail. Discharge orders received from Dr. Matthew Saras.

## 2018-01-06 NOTE — Progress Notes (Signed)
Patient ID: Teresa Barber, female   DOB: 03/10/1976, 42 y.o.   MRN: [id number]   [redacted]w[redacted]d  S// rested well, irreg ctx  O// BP 111/61 (BP Location: Right Arm)   Pulse 82   Temp 98 F (36.7 C) (Oral)   Resp 18   Ht 5\' 7"  (1.702 m)   Wt 172 lb 1.9 oz (78.1 kg)   LMP 06/21/2017   SpO2 100%   BMI 26.96 kg/m   FHR 142  A+P// [redacted]w[redacted]d, PTL, will D/ C Mag>>US this am, second BMZ this am prob D/C later today

## 2018-01-18 ENCOUNTER — Encounter (HOSPITAL_COMMUNITY): Payer: Self-pay

## 2018-01-18 ENCOUNTER — Ambulatory Visit (HOSPITAL_COMMUNITY): Payer: BLUE CROSS/BLUE SHIELD

## 2018-02-15 ENCOUNTER — Encounter (HOSPITAL_COMMUNITY): Payer: Self-pay

## 2018-02-15 ENCOUNTER — Other Ambulatory Visit (HOSPITAL_COMMUNITY): Payer: Self-pay | Admitting: Obstetrics and Gynecology

## 2018-02-15 ENCOUNTER — Ambulatory Visit (HOSPITAL_COMMUNITY)
Admission: RE | Admit: 2018-02-15 | Discharge: 2018-02-15 | Disposition: A | Discharge status: Home / Self Care | Payer: BLUE CROSS/BLUE SHIELD | Source: Ambulatory Visit | Attending: Obstetrics and Gynecology | Admitting: Obstetrics and Gynecology

## 2018-02-15 DIAGNOSIS — O34219 Maternal care for unspecified type scar from previous cesarean delivery: Secondary | ICD-10-CM | POA: Diagnosis not present

## 2018-02-15 DIAGNOSIS — O09529 Supervision of elderly multigravida, unspecified trimester: Secondary | ICD-10-CM

## 2018-02-15 DIAGNOSIS — D259 Leiomyoma of uterus, unspecified: Secondary | ICD-10-CM | POA: Diagnosis not present

## 2018-02-15 DIAGNOSIS — Z98891 History of uterine scar from previous surgery: Secondary | ICD-10-CM

## 2018-02-15 DIAGNOSIS — Z8751 Personal history of pre-term labor: Secondary | ICD-10-CM

## 2018-02-15 DIAGNOSIS — O4703 False labor before 37 completed weeks of gestation, third trimester: Secondary | ICD-10-CM

## 2018-02-15 DIAGNOSIS — O09523 Supervision of elderly multigravida, third trimester: Secondary | ICD-10-CM

## 2018-02-15 DIAGNOSIS — O3413 Maternal care for benign tumor of corpus uteri, third trimester: Secondary | ICD-10-CM | POA: Insufficient documentation

## 2018-02-15 DIAGNOSIS — Z3A34 34 weeks gestation of pregnancy: Secondary | ICD-10-CM | POA: Diagnosis not present

## 2018-02-15 DIAGNOSIS — O09213 Supervision of pregnancy with history of pre-term labor, third trimester: Secondary | ICD-10-CM | POA: Insufficient documentation

## 2018-02-15 NOTE — Addendum Note (Signed)
Encounter addended by: Jeanene Erb, RT on: 02/15/2018 11:23 AM  Actions taken: Imaging Exam ended

## 2018-02-28 LAB — OB RESULTS CONSOLE GBS: STREP GROUP B AG: NEGATIVE

## 2018-03-23 ENCOUNTER — Encounter (HOSPITAL_COMMUNITY): Payer: Self-pay | Admitting: *Deleted

## 2018-03-23 ENCOUNTER — Telehealth (HOSPITAL_COMMUNITY): Payer: Self-pay | Admitting: *Deleted

## 2018-03-23 NOTE — Telephone Encounter (Signed)
Preadmission screen  

## 2018-03-26 ENCOUNTER — Telehealth (HOSPITAL_COMMUNITY): Payer: Self-pay | Admitting: *Deleted

## 2018-03-26 NOTE — Telephone Encounter (Signed)
Preadmission screen  

## 2018-03-28 NOTE — H&P (Signed)
Teresa Barber is a 42 y.o. female presenting for IOL @ post dates. Pregnancy complicated by fibroid uterus, S/P LTCS @ 29 3/7 weeks for placental abruption, AMA with normal NIPT, HSV with no prodrome or lesions. U/S in office @ 39 1/7 EFW=7# 10oz, vtx, AFI 18.8. OB History    Gravida  7   Para  3   Term  2   Preterm  1   AB  3   Living  3     SAB  2   TAB  1   Ectopic      Multiple      Live Births  3          Past Medical History:  Diagnosis Date  . AMA (advanced maternal age) multigravida 58+   . Asthma   . Fibroid   . HSV-2 infection   . Hx of chlamydia infection    Past Surgical History:  Procedure Laterality Date  . CESAREAN SECTION    . NO PAST SURGERIES     Family History: family history includes Asthma in her mother; Early death in her mother; Multiple sclerosis in her maternal aunt; Thyroid disease in her father, paternal aunt, and sister. Social History:  reports that she has never smoked. She has never used smokeless tobacco. She reports that she does not drink alcohol or use drugs.     Maternal Diabetes: No Genetic Screening: Normal Maternal Ultrasounds/Referrals: Normal Fetal Ultrasounds or other Referrals:  None Maternal Substance Abuse:  No Significant Maternal Medications:  None Significant Maternal Lab Results:  None Other Comments:  None  Review of Systems  Eyes: Negative for blurred vision.  Gastrointestinal: Negative for abdominal pain.  Neurological: Negative for headaches.   Maternal Medical History:  Fetal activity: Perceived fetal activity is normal.        Last menstrual period 06/21/2017, unknown if currently breastfeeding. Maternal Exam:  Abdomen: Fetal presentation: vertex     Physical Exam  Cardiovascular: Normal rate and regular rhythm.  Respiratory: Effort normal and breath sounds normal.  GI: Soft. There is no tenderness.  Neurological: She has normal reflexes.    Prenatal labs: ABO, Rh: --/--/O POS  (05/17 0820) Antibody: NEG (05/17 0820) Rubella: Immune (01/09 0000) RPR: Nonreactive (01/09 0000)  HBsAg: Negative (01/09 0000)  HIV: Non-reactive (01/09 0000)  GBS:   negative  Assessment/Plan: 42 yo G7P3 @ 40 1/7 weeks for IOL Risks of TOLAC reviewed in office   Teresa Barber 03/28/2018, 2:18 PM

## 2018-03-29 ENCOUNTER — Inpatient Hospital Stay (HOSPITAL_COMMUNITY)
Admission: RE | Admit: 2018-03-29 | Discharge: 2018-03-31 | DRG: 807 | Disposition: A | Discharge status: Home / Self Care | Payer: BLUE CROSS/BLUE SHIELD | Attending: Obstetrics and Gynecology | Admitting: Obstetrics and Gynecology

## 2018-03-29 ENCOUNTER — Encounter (HOSPITAL_COMMUNITY): Payer: Self-pay

## 2018-03-29 ENCOUNTER — Other Ambulatory Visit: Payer: Self-pay

## 2018-03-29 DIAGNOSIS — O480 Post-term pregnancy: Principal | ICD-10-CM | POA: Diagnosis present

## 2018-03-29 DIAGNOSIS — Z3A40 40 weeks gestation of pregnancy: Secondary | ICD-10-CM | POA: Diagnosis not present

## 2018-03-29 DIAGNOSIS — O3413 Maternal care for benign tumor of corpus uteri, third trimester: Secondary | ICD-10-CM | POA: Diagnosis present

## 2018-03-29 DIAGNOSIS — O34219 Maternal care for unspecified type scar from previous cesarean delivery: Secondary | ICD-10-CM | POA: Diagnosis present

## 2018-03-29 DIAGNOSIS — D259 Leiomyoma of uterus, unspecified: Secondary | ICD-10-CM | POA: Diagnosis present

## 2018-03-29 LAB — CBC
HEMATOCRIT: 36.8 % (ref 36.0–46.0)
HEMOGLOBIN: 12.3 g/dL (ref 12.0–15.0)
MCH: 29 pg (ref 26.0–34.0)
MCHC: 33.4 g/dL (ref 30.0–36.0)
MCV: 86.8 fL (ref 78.0–100.0)
Platelets: 134 10*3/uL — ABNORMAL LOW (ref 150–400)
RBC: 4.24 MIL/uL (ref 3.87–5.11)
RDW: 14.3 % (ref 11.5–15.5)
WBC: 10.5 10*3/uL (ref 4.0–10.5)

## 2018-03-29 LAB — TYPE AND SCREEN
ABO/RH(D): O POS
Antibody Screen: NEGATIVE

## 2018-03-29 MED ORDER — OXYTOCIN 40 UNITS IN LACTATED RINGERS INFUSION - SIMPLE MED
2.5000 [IU]/h | INTRAVENOUS | Status: DC
  Administered 2018-03-30: 2.5 [IU]/h via INTRAVENOUS

## 2018-03-29 MED ORDER — EPHEDRINE 5 MG/ML INJ
10.0000 mg | INTRAVENOUS | Status: DC | PRN
  Filled 2018-03-29: qty 2

## 2018-03-29 MED ORDER — LACTATED RINGERS IV SOLN: 500.0000 mL | INTRAVENOUS | Status: DC | PRN

## 2018-03-29 MED ORDER — PHENYLEPHRINE 40 MCG/ML (10ML) SYRINGE FOR IV PUSH (FOR BLOOD PRESSURE SUPPORT)
80.0000 ug | PREFILLED_SYRINGE | INTRAVENOUS | Status: DC | PRN
  Administered 2018-03-30: 80 ug via INTRAVENOUS
  Filled 2018-03-29: qty 5
  Filled 2018-03-29: qty 10

## 2018-03-29 MED ORDER — OXYCODONE-ACETAMINOPHEN 5-325 MG PO TABS: 1.0000 | ORAL_TABLET | ORAL | Status: DC | PRN

## 2018-03-29 MED ORDER — BUTORPHANOL TARTRATE 1 MG/ML IJ SOLN: 1.0000 mg | INTRAMUSCULAR | Status: DC | PRN

## 2018-03-29 MED ORDER — PHENYLEPHRINE 40 MCG/ML (10ML) SYRINGE FOR IV PUSH (FOR BLOOD PRESSURE SUPPORT)
80.0000 ug | PREFILLED_SYRINGE | INTRAVENOUS | Status: DC | PRN
  Filled 2018-03-29: qty 5

## 2018-03-29 MED ORDER — FLEET ENEMA 7-19 GM/118ML RE ENEM: 1.0000 | ENEMA | RECTAL | Status: DC | PRN

## 2018-03-29 MED ORDER — ONDANSETRON HCL 4 MG/2ML IJ SOLN: 4.0000 mg | Freq: Four times a day (QID) | INTRAMUSCULAR | Status: DC | PRN

## 2018-03-29 MED ORDER — FENTANYL 2.5 MCG/ML BUPIVACAINE 1/10 % EPIDURAL INFUSION (WH - ANES)
14.0000 mL/h | INTRAMUSCULAR | Status: DC | PRN
  Administered 2018-03-30: 14 mL/h via EPIDURAL
  Filled 2018-03-29: qty 100

## 2018-03-29 MED ORDER — DIPHENHYDRAMINE HCL 50 MG/ML IJ SOLN: 12.5000 mg | INTRAMUSCULAR | Status: DC | PRN

## 2018-03-29 MED ORDER — LACTATED RINGERS IV SOLN: 500.0000 mL | Freq: Once | INTRAVENOUS | Status: DC

## 2018-03-29 MED ORDER — OXYTOCIN BOLUS FROM INFUSION
500.0000 mL | Freq: Once | INTRAVENOUS | Status: AC
  Administered 2018-03-30: 500 mL via INTRAVENOUS

## 2018-03-29 MED ORDER — SOD CITRATE-CITRIC ACID 500-334 MG/5ML PO SOLN: 30.0000 mL | ORAL | Status: DC | PRN

## 2018-03-29 MED ORDER — LACTATED RINGERS IV SOLN
INTRAVENOUS | Status: DC
  Administered 2018-03-29 (×2): via INTRAVENOUS

## 2018-03-29 MED ORDER — ACETAMINOPHEN 325 MG PO TABS: 650.0000 mg | ORAL_TABLET | ORAL | Status: DC | PRN

## 2018-03-29 MED ORDER — OXYTOCIN 40 UNITS IN LACTATED RINGERS INFUSION - SIMPLE MED
1.0000 m[IU]/min | INTRAVENOUS | Status: DC
  Administered 2018-03-29: 9 m[IU]/min via INTRAVENOUS
  Administered 2018-03-29: 5 m[IU]/min via INTRAVENOUS
  Administered 2018-03-29: 7 m[IU]/min via INTRAVENOUS
  Administered 2018-03-29: 3 m[IU]/min via INTRAVENOUS
  Administered 2018-03-29: 1 m[IU]/min via INTRAVENOUS
  Administered 2018-03-29: 6 m[IU]/min via INTRAVENOUS
  Administered 2018-03-29: 4 m[IU]/min via INTRAVENOUS
  Filled 2018-03-29: qty 1000

## 2018-03-29 MED ORDER — LIDOCAINE HCL (PF) 1 % IJ SOLN
30.0000 mL | INTRAMUSCULAR | Status: DC | PRN
  Administered 2018-03-30: 30 mL via SUBCUTANEOUS
  Filled 2018-03-29: qty 30

## 2018-03-29 MED ORDER — TERBUTALINE SULFATE 1 MG/ML IJ SOLN
0.2500 mg | Freq: Once | INTRAMUSCULAR | Status: DC | PRN
  Filled 2018-03-29: qty 1

## 2018-03-29 MED ORDER — OXYCODONE-ACETAMINOPHEN 5-325 MG PO TABS: 2.0000 | ORAL_TABLET | ORAL | Status: DC | PRN

## 2018-03-29 NOTE — Progress Notes (Signed)
FHT cat one UCs q3-4 min Cx 2/50/-2 AROM clear

## 2018-03-29 NOTE — Progress Notes (Signed)
Pt up to BR independently with steady gate.

## 2018-03-29 NOTE — Progress Notes (Signed)
No change to H&P per patient history FHT cat one UCs irritabiltiy Cx 2/soft/vtx/-2 A/P: D/W patient > pitocin

## 2018-03-30 ENCOUNTER — Inpatient Hospital Stay (HOSPITAL_COMMUNITY): Payer: BLUE CROSS/BLUE SHIELD | Admitting: Anesthesiology

## 2018-03-30 ENCOUNTER — Encounter (HOSPITAL_COMMUNITY): Payer: Self-pay

## 2018-03-30 LAB — CBC
HCT: 38.4 % (ref 36.0–46.0)
HEMATOCRIT: 40.8 % (ref 36.0–46.0)
Hemoglobin: 13.1 g/dL (ref 12.0–15.0)
Hemoglobin: 13.8 g/dL (ref 12.0–15.0)
MCH: 29.3 pg (ref 26.0–34.0)
MCH: 29.3 pg (ref 26.0–34.0)
MCHC: 33.8 g/dL (ref 30.0–36.0)
MCHC: 34.1 g/dL (ref 30.0–36.0)
MCV: 85.9 fL (ref 78.0–100.0)
MCV: 86.6 fL (ref 78.0–100.0)
Platelets: 111 10*3/uL — ABNORMAL LOW (ref 150–400)
Platelets: 121 10*3/uL — ABNORMAL LOW (ref 150–400)
RBC: 4.47 MIL/uL (ref 3.87–5.11)
RBC: 4.71 MIL/uL (ref 3.87–5.11)
RDW: 14.1 % (ref 11.5–15.5)
RDW: 14.2 % (ref 11.5–15.5)
WBC: 16.1 10*3/uL — AB (ref 4.0–10.5)
WBC: 18.2 10*3/uL — AB (ref 4.0–10.5)

## 2018-03-30 LAB — RPR: RPR Ser Ql: NONREACTIVE

## 2018-03-30 MED ORDER — ONDANSETRON HCL 4 MG/2ML IJ SOLN: 4.0000 mg | INTRAMUSCULAR | Status: DC | PRN

## 2018-03-30 MED ORDER — OXYCODONE HCL 5 MG PO TABS: 10.0000 mg | ORAL_TABLET | ORAL | Status: DC | PRN

## 2018-03-30 MED ORDER — DIBUCAINE 1 % RE OINT: 1.0000 "application " | TOPICAL_OINTMENT | RECTAL | Status: DC | PRN

## 2018-03-30 MED ORDER — SENNOSIDES-DOCUSATE SODIUM 8.6-50 MG PO TABS
2.0000 | ORAL_TABLET | ORAL | Status: DC
  Filled 2018-03-30: qty 2

## 2018-03-30 MED ORDER — TETANUS-DIPHTH-ACELL PERTUSSIS 5-2.5-18.5 LF-MCG/0.5 IM SUSP: 0.5000 mL | Freq: Once | INTRAMUSCULAR | Status: DC

## 2018-03-30 MED ORDER — OXYCODONE HCL 5 MG PO TABS: 5.0000 mg | ORAL_TABLET | ORAL | Status: DC | PRN

## 2018-03-30 MED ORDER — ONDANSETRON HCL 4 MG PO TABS: 4.0000 mg | ORAL_TABLET | ORAL | Status: DC | PRN

## 2018-03-30 MED ORDER — PRENATAL MULTIVITAMIN CH
1.0000 | ORAL_TABLET | Freq: Every day | ORAL | Status: DC
  Administered 2018-03-31: 1 via ORAL
  Filled 2018-03-30: qty 1

## 2018-03-30 MED ORDER — IBUPROFEN 600 MG PO TABS
600.0000 mg | ORAL_TABLET | Freq: Four times a day (QID) | ORAL | Status: DC
  Administered 2018-03-30 – 2018-03-31 (×6): 600 mg via ORAL
  Filled 2018-03-30 (×6): qty 1

## 2018-03-30 MED ORDER — LIDOCAINE HCL (PF) 1 % IJ SOLN
INTRAMUSCULAR | Status: DC | PRN
  Administered 2018-03-30: 13 mL via EPIDURAL

## 2018-03-30 MED ORDER — ZOLPIDEM TARTRATE 5 MG PO TABS: 5.0000 mg | ORAL_TABLET | Freq: Every evening | ORAL | Status: DC | PRN

## 2018-03-30 MED ORDER — WITCH HAZEL-GLYCERIN EX PADS: 1.0000 "application " | MEDICATED_PAD | CUTANEOUS | Status: DC | PRN

## 2018-03-30 MED ORDER — ACETAMINOPHEN 325 MG PO TABS
650.0000 mg | ORAL_TABLET | ORAL | Status: DC | PRN
  Filled 2018-03-30: qty 2

## 2018-03-30 MED ORDER — DIPHENHYDRAMINE HCL 25 MG PO CAPS: 25.0000 mg | ORAL_CAPSULE | Freq: Four times a day (QID) | ORAL | Status: DC | PRN

## 2018-03-30 MED ORDER — SIMETHICONE 80 MG PO CHEW: 80.0000 mg | CHEWABLE_TABLET | ORAL | Status: DC | PRN

## 2018-03-30 MED ORDER — COCONUT OIL OIL
1.0000 "application " | TOPICAL_OIL | Status: DC | PRN
  Administered 2018-03-30: 1 via TOPICAL
  Filled 2018-03-30: qty 120

## 2018-03-30 MED ORDER — BENZOCAINE-MENTHOL 20-0.5 % EX AERO: 1.0000 "application " | INHALATION_SPRAY | CUTANEOUS | Status: DC | PRN

## 2018-03-30 NOTE — Anesthesia Preprocedure Evaluation (Signed)
Anesthesia Evaluation  Patient identified by MRN, date of birth, ID band Patient awake    Reviewed: Allergy & Precautions, NPO status , Patient's Chart, lab work & pertinent test results  Airway Mallampati: II  TM Distance: >3 FB Neck ROM: Full    Dental no notable dental hx.    Pulmonary neg pulmonary ROS,    Pulmonary exam normal breath sounds clear to auscultation       Cardiovascular negative cardio ROS Normal cardiovascular exam Rhythm:Regular Rate:Normal     Neuro/Psych negative neurological ROS  negative psych ROS   GI/Hepatic negative GI ROS, Neg liver ROS,   Endo/Other  negative endocrine ROS  Renal/GU negative Renal ROS  negative genitourinary   Musculoskeletal negative musculoskeletal ROS (+)   Abdominal   Peds negative pediatric ROS (+)  Hematology negative hematology ROS (+)   Anesthesia Other Findings   Reproductive/Obstetrics negative OB ROS (+) Pregnancy                             Anesthesia Physical Anesthesia Plan  ASA: II  Anesthesia Plan: Epidural   Post-op Pain Management:    Induction:   PONV Risk Score and Plan:   Airway Management Planned:   Additional Equipment:   Intra-op Plan:   Post-operative Plan:   Informed Consent:   Plan Discussed with:   Anesthesia Plan Comments:         Anesthesia Quick Evaluation

## 2018-03-30 NOTE — Anesthesia Postprocedure Evaluation (Signed)
Anesthesia Post Note  Patient: Teresa Barber  Procedure(s) Performed: AN AD HOC LABOR EPIDURAL     Anesthesia Post Evaluation  Last Vitals:  Vitals:   03/30/18 0500 03/30/18 0610  BP: 114/71 (!) 101/58  Pulse: 71 67  Resp: 18 18  Temp: 36.8 C 36.9 C    Last Pain:  Vitals:   03/30/18 0610  TempSrc: Oral  PainSc:    Pain Goal: Patients Stated Pain Goal: 6 (03/30/18 0050)               Everette Rank

## 2018-03-30 NOTE — Lactation Note (Signed)
This note was copied from a baby's chart. Lactation Consultation Note  Patient Name: Teresa Barber [id number] Date: 03/30/2018 Reason for consult: Initial assessment;Term  P4 mother whose infant is now 55 hours old.  Mother breastfed her other 3 children. (2 years, 18 months and the last child for only a couple of weeks due to NICU baby and increased blood loss from pregnancy which resulted in low milk supply.)  Mother hopes to breastfeed this baby as long as possible.  Infant showing cues as I arrived so I offered to assist with latch.  Mother accepted.  Mother's breasts are soft and non tender with erect nipples.  Assisted to latch in the football hold on the right breast without difficulty.  Baby had very wide open mouth and flanged lips.  Mother felt uterine contractions with sucking.  She had no pain with latch.  I emphasized the importance of a deep latch and to keep baby in tight to breast tissue.  Taught mother how to do breast compressions during feeds and to do hand expression before and after feedings to increase milk supply.  Colostrum container provided for any EBM she may obtain with hand expression.  Encouraged to feed 8-12 times/24 hours or sooner if baby is showing cues.  Continue STS, breast massage and hand expression.  Mom made aware of O/P services, breastfeeding support groups, community resources, and our phone # for post-discharge questions. Family present.  Mother will call for assistance as needed.   Maternal Data Formula Feeding for Exclusion: No Has patient been taught Hand Expression?: Yes Does the patient have breastfeeding experience prior to this delivery?: Yes  Feeding Feeding Type: Breast Fed Length of feed: 10 min(still feeding when I left the room)  LATCH Score Latch: Grasps breast easily, tongue down, lips flanged, rhythmical sucking.  Audible Swallowing: None  Type of Nipple: Everted at rest and after stimulation  Comfort (Breast/Nipple): Soft /  non-tender  Hold (Positioning): Assistance needed to correctly position infant at breast and maintain latch.  LATCH Score: 7  Interventions Interventions: Breast feeding basics reviewed;Assisted with latch;Skin to skin;Breast massage;Hand express;Position options;Support pillows;Adjust position;Breast compression;Coconut oil  Lactation Tools Discussed/Used     Consult Status Consult Status: Follow-up Date: 03/31/18 Follow-up type: In-patient    [name] 03/30/2018, 12:30 PM

## 2018-03-30 NOTE — Anesthesia Procedure Notes (Signed)
Epidural Patient location during procedure: OB Start time: 03/30/2018 12:28 AM End time: 03/30/2018 12:46 AM  Staffing Anesthesiologist: Lynda Rainwater, MD Performed: anesthesiologist   Preanesthetic Checklist Completed: patient identified, site marked, surgical consent, pre-op evaluation, timeout performed, IV checked, risks and benefits discussed and monitors and equipment checked  Epidural Patient position: sitting Prep: ChloraPrep Patient monitoring: heart rate, cardiac monitor, continuous pulse ox and blood pressure Approach: midline Location: L2-L3 Injection technique: LOR saline  Needle:  Needle type: Tuohy  Needle gauge: 17 G Needle length: 9 cm Needle insertion depth: 5 cm Catheter type: closed end flexible Catheter size: 20 Guage Catheter at skin depth: 9 cm Test dose: negative  Assessment Events: blood not aspirated, injection not painful, no injection resistance, negative IV test and no paresthesia  Additional Notes Reason for block:procedure for pain

## 2018-03-30 NOTE — Progress Notes (Signed)
Delivery Note At 2:47 AM a viable female was delivered via VBAC, Spontaneous (Presentation:LOA ;  ).  APGAR: , ; weight  .   Placenta status:intact , .  Cord: 3 vessels with the following complications:true knot in cord .  Cord pH: pending  Anesthesia:   Episiotomy:  none Lacerations:  bilat second degree peri urethral lacs repaired Suture Repair: 2.0 vicryl rapide Est. Blood Loss (mL):  250  Mom to postpartum.  Baby to Couplet care / Skin to Skin.  Shon Millet II 03/30/2018, 3:01 AM

## 2018-03-30 NOTE — Anesthesia Postprocedure Evaluation (Signed)
Anesthesia Post Note  Patient: Teresa Barber  Procedure(s) Performed: AN AD HOC LABOR EPIDURAL     Patient location during evaluation: Mother Baby Anesthesia Type: Epidural Level of consciousness: awake Pain management: pain level controlled Vital Signs Assessment: post-procedure vital signs reviewed and stable Respiratory status: spontaneous breathing Cardiovascular status: stable Postop Assessment: patient able to bend at knees and epidural receding Anesthetic complications: no    Last Vitals:  Vitals:   03/30/18 0500 03/30/18 0610  BP: 114/71 (!) 101/58  Pulse: 71 67  Resp: 18 18  Temp: 36.8 C 36.9 C    Last Pain:  Vitals:   03/30/18 0610  TempSrc: Oral  PainSc:    Pain Goal: Patients Stated Pain Goal: 6 (03/30/18 0050)               Everette Rank

## 2018-03-31 NOTE — Discharge Summary (Signed)
Obstetric Discharge Summary Reason for Admission: induction of labor Prenatal Procedures: none Intrapartum Procedures: spontaneous vaginal delivery Postpartum Procedures: none Complications-Operative and Postpartum: none Hemoglobin  Date Value Ref Range Status  03/30/2018 13.1 12.0 - 15.0 g/dL Final   HCT  Date Value Ref Range Status  03/30/2018 38.4 36.0 - 46.0 % Final    Physical Exam:  General: alert Lochia: appropriate Uterine Fundus: firm Incision: healing well DVT Evaluation: No evidence of DVT seen on physical exam.  Discharge Diagnoses: Term Pregnancy-delivered  Discharge Information: Date: 03/31/2018 Activity: pelvic rest Diet: routine Medications: PNV Condition: stable Instructions: refer to practice specific booklet Discharge to: home [place] Schedule an appointment as soon as possible for a visit in 6 week(s).   Contact information: [place] [address] [phone]           Newborn Data: Live born female  Birth Weight: 8 lb 1.5 oz (3671 g) APGAR: 18, 9  Newborn Delivery   Birth date/time:  03/30/2018 02:47:00 Delivery type:  VBAC, Spontaneous     Home with mother.  [place] 03/31/2018, 8:38 AM

## 2018-03-31 NOTE — Lactation Note (Signed)
This note was copied from a baby's chart. Lactation Consultation Note  Patient Name: Teresa Barber [id number] Date: 03/31/2018 Reason for consult: Follow-up assessment;Infant weight loss(4% weight loss )  As LC entered the room, baby latched with depth. Swallows noted, LC showed  Mom how to do breast compressions with permission.  Increased swallows noted.  Mom denies soreness.  Sore  Nipple and engorgement prevention and tx reviewed.  Per mom has a hand pump at home and that is what she normally uses.  Mother informed of post-discharge support and given phone number to the lactation department, including services for phone call assistance; out-patient appointments; and breastfeeding support group. List of other breastfeeding resources in the community given in the handout. Encouraged mother to call for problems or concerns related to breastfeeding.   Maternal Data Has patient been taught Hand Expression?: Yes Does the patient have breastfeeding experience prior to this delivery?: Yes  Feeding Feeding Type: (baby alraedy latched with swallows , showed mom breast compressions ) Length of feed: 20 min(LC observed baby already latched/ swallows noted )  LATCH Score Latch: (latched with depth )  Audible Swallowing: (multiple swallows )  Type of Nipple: (nipple well rounded when baby released )  Comfort (Breast/Nipple): (per  mom breast are fuller )  Hold (Positioning): (mom independent with latch )     Interventions Interventions: Breast feeding basics reviewed;Skin to skin;Breast compression  Lactation Tools Discussed/Used     Consult Status Consult Status: Complete Date: 03/31/18    [place] 03/31/2018, 1:22 PM

## 2018-03-31 NOTE — Progress Notes (Signed)
Parent request formula to supplement breast feeding due to feeling like baby is not getting enough, excessive crying.  Parents have been informed of small tummy size of newborn, taught hand expression and understand the possible consequences of formula to the health of the infant. The possible consequences shared with patient include 1) Loss of confidence in breastfeeding 2) Engorgement 3) Allergic sensitization of baby(asthma/allergies) and 4) decreased milk supply for mother.After discussion of the above the mother decided to supplement. The tool used to give formula supplement will be given.  Mother counseled to avoid artificial nipples because this practice may lead to latch difficulties,inadequate milk transfer and nipple soreness.

## 2021-01-19 ENCOUNTER — Other Ambulatory Visit: Payer: Self-pay | Admitting: Obstetrics and Gynecology

## 2021-01-19 DIAGNOSIS — R1900 Intra-abdominal and pelvic swelling, mass and lump, unspecified site: Secondary | ICD-10-CM

## 2021-01-26 ENCOUNTER — Other Ambulatory Visit: Payer: Self-pay

## 2021-01-26 ENCOUNTER — Emergency Department (HOSPITAL_COMMUNITY)
Admission: EM | Admit: 2021-01-26 | Discharge: 2021-01-26 | Discharge status: Against Medical Advice | Payer: BLUE CROSS/BLUE SHIELD | Attending: Emergency Medicine | Admitting: Emergency Medicine

## 2021-01-26 DIAGNOSIS — R1902 Left upper quadrant abdominal swelling, mass and lump: Secondary | ICD-10-CM | POA: Insufficient documentation

## 2021-01-26 DIAGNOSIS — Z5321 Procedure and treatment not carried out due to patient leaving prior to being seen by health care provider: Secondary | ICD-10-CM | POA: Insufficient documentation

## 2021-01-26 LAB — COMPREHENSIVE METABOLIC PANEL
ALT: 14 U/L (ref 0–44)
AST: 18 U/L (ref 15–41)
Albumin: 3.7 g/dL (ref 3.5–5.0)
Alkaline Phosphatase: 44 U/L (ref 38–126)
Anion gap: 5 (ref 5–15)
BUN: 11 mg/dL (ref 6–20)
CO2: 27 mmol/L (ref 22–32)
Calcium: 9 mg/dL (ref 8.9–10.3)
Chloride: 104 mmol/L (ref 98–111)
Creatinine, Ser: 0.77 mg/dL (ref 0.44–1.00)
GFR, Estimated: >60 mL/min (ref >60)
Glucose, Bld: 99 mg/dL (ref 70–99)
Potassium: 4.1 mmol/L (ref 3.5–5.1)
Sodium: 136 mmol/L (ref 135–145)
Total Bilirubin: 0.8 mg/dL (ref 0.3–1.2)
Total Protein: 6.7 g/dL (ref 6.5–8.1)

## 2021-01-26 LAB — CBC
HCT: 35.4 % — ABNORMAL LOW (ref 36.0–46.0)
Hemoglobin: 11 g/dL — ABNORMAL LOW (ref 12.0–15.0)
MCH: 24.4 pg — ABNORMAL LOW (ref 26.0–34.0)
MCHC: 31.1 g/dL (ref 30.0–36.0)
MCV: 78.7 fL — ABNORMAL LOW (ref 80.0–100.0)
Platelets: 256 10*3/uL (ref 150–400)
RBC: 4.5 MIL/uL (ref 3.87–5.11)
RDW: 15 % (ref 11.5–15.5)
WBC: 6.4 10*3/uL (ref 4.0–10.5)
nRBC: 0 % (ref 0.0–0.2)

## 2021-01-26 LAB — I-STAT BETA HCG BLOOD, ED (MC, WL, AP ONLY): I-stat hCG, quantitative: <5 m[IU]/mL (ref <5)

## 2021-01-26 LAB — LIPASE, BLOOD: Lipase: 36 U/L (ref 11–51)

## 2021-01-26 NOTE — ED Notes (Signed)
Pt called x4 for vitals and no answer. She is not in the waiting room or outside, moving pt OTF.

## 2021-01-26 NOTE — ED Provider Notes (Addendum)
Emergency Medicine Provider Triage Evaluation Note  Teresa Barber , a 45 y.o. female  was evaluated in triage.  Pt complains of abdominal pain x two weeks. Reports noticing a lump in her upper abdomen. No nausea, vomiting, fever, diarrhea. H/O C section, no other abdominal surgeries.   Review of Systems  Positive: Abdominal mas, abdominal pain  Negative: See above   Physical Exam  BP 115/75 (BP Location: Right Arm)   Pulse 84   Temp 98.9 F (37.2 C) (Oral)   Resp 15   SpO2 100%  Gen:   Awake, no distress   Resp:  Normal effort  MSK:   Moves extremities without difficulty  Other:  Tenderness upper epigastric area. Palpable knot, could be a muscle or hernia. Not mobile, not mesh, not exacerbated by cough.   Medical Decision Making  Medically screening exam initiated at 10:49 AM.  Appropriate orders placed.  Teresa Barber was informed that the remainder of the evaluation will be completed by another provider, this initial triage assessment does not replace that evaluation, and the importance of remaining in the ED until their evaluation is complete.     Sherrill Raring, PA-C 01/26/21 [address], PA-C 01/26/21 1052    Charlesetta Shanks, MD 02/02/21 2208

## 2021-01-26 NOTE — ED Triage Notes (Signed)
Pt reports lump in LUQ x 1.5 weeks that is becoming increasingly painful. Feels better with pressure is applied to the area. Denies n/v/d.

## 2021-02-18 ENCOUNTER — Other Ambulatory Visit: Payer: Self-pay

## 2021-02-18 ENCOUNTER — Ambulatory Visit
Admission: RE | Admit: 2021-02-18 | Discharge: 2021-02-18 | Disposition: A | Discharge status: Home / Self Care | Payer: Medicaid Other | Source: Ambulatory Visit | Attending: Obstetrics and Gynecology | Admitting: Obstetrics and Gynecology

## 2021-02-18 DIAGNOSIS — R1900 Intra-abdominal and pelvic swelling, mass and lump, unspecified site: Secondary | ICD-10-CM

## 2021-02-18 MED ORDER — IOPAMIDOL (ISOVUE-300) INJECTION 61%
100.0000 mL | Freq: Once | INTRAVENOUS | Status: AC | PRN
  Administered 2021-02-18: 100 mL via INTRAVENOUS

## 2023-12-05 ENCOUNTER — Telehealth: Payer: Self-pay

## 2023-12-05 NOTE — Telephone Encounter (Signed)
 Patient called and requested a mammogram scholarship application.[facility]
